# Patient Record
Sex: Female | Born: 1955 | Race: White | Hispanic: No | Smoking: Never smoker
Health system: Southern US, Community
[De-identification: ages and names within clinical notes are randomized; demographics above are authoritative.]

## PROBLEM LIST (undated history)

## (undated) DIAGNOSIS — F419 Anxiety disorder, unspecified: Secondary | ICD-10-CM

## (undated) DIAGNOSIS — Z780 Asymptomatic menopausal state: Secondary | ICD-10-CM

## (undated) DIAGNOSIS — G43909 Migraine, unspecified, not intractable, without status migrainosus: Secondary | ICD-10-CM

## (undated) DIAGNOSIS — IMO0002 Reserved for concepts with insufficient information to code with codable children: Secondary | ICD-10-CM

## (undated) DIAGNOSIS — E785 Hyperlipidemia, unspecified: Secondary | ICD-10-CM

## (undated) HISTORY — DX: Anxiety disorder, unspecified: F41.9

## (undated) HISTORY — DX: Migraine, unspecified, not intractable, without status migrainosus: G43.909

## (undated) HISTORY — DX: Reserved for concepts with insufficient information to code with codable children: IMO0002

## (undated) HISTORY — DX: Hyperlipidemia, unspecified: E78.5

## (undated) HISTORY — DX: Asymptomatic menopausal state: Z78.0

---

## 1977-01-14 HISTORY — PX: KNEE SURGERY: SHX244

## 1986-01-14 HISTORY — PX: OTHER SURGICAL HISTORY: SHX169

## 1988-01-15 HISTORY — PX: TEMPOROMANDIBULAR JOINT SURGERY: SHX35

## 1998-08-22 ENCOUNTER — Other Ambulatory Visit: Admission: RE | Admit: 1998-08-22 | Discharge: 1998-08-22 | Payer: Self-pay | Admitting: Plastic Surgery

## 2000-05-26 ENCOUNTER — Ambulatory Visit (HOSPITAL_BASED_OUTPATIENT_CLINIC_OR_DEPARTMENT_OTHER): Admission: RE | Admit: 2000-05-26 | Discharge: 2000-05-26 | Payer: Self-pay | Admitting: Plastic Surgery

## 2009-01-14 DIAGNOSIS — Z780 Asymptomatic menopausal state: Secondary | ICD-10-CM

## 2009-01-14 HISTORY — DX: Asymptomatic menopausal state: Z78.0

## 2010-01-14 HISTORY — PX: MOHS SURGERY: SUR867

## 2011-06-17 ENCOUNTER — Ambulatory Visit (INDEPENDENT_AMBULATORY_CARE_PROVIDER_SITE_OTHER): Payer: Self-pay | Admitting: Internal Medicine

## 2011-06-17 ENCOUNTER — Encounter: Payer: Self-pay | Admitting: Internal Medicine

## 2011-06-17 VITALS — BP 136/84 | HR 66 | Temp 97.9°F | Ht 68.75 in | Wt 137.5 lb

## 2011-06-17 DIAGNOSIS — N951 Menopausal and female climacteric states: Secondary | ICD-10-CM

## 2011-06-17 DIAGNOSIS — E785 Hyperlipidemia, unspecified: Secondary | ICD-10-CM | POA: Insufficient documentation

## 2011-06-17 DIAGNOSIS — Z780 Asymptomatic menopausal state: Secondary | ICD-10-CM

## 2011-06-17 DIAGNOSIS — Z8669 Personal history of other diseases of the nervous system and sense organs: Secondary | ICD-10-CM

## 2011-06-17 DIAGNOSIS — Z139 Encounter for screening, unspecified: Secondary | ICD-10-CM

## 2011-06-17 DIAGNOSIS — B977 Papillomavirus as the cause of diseases classified elsewhere: Secondary | ICD-10-CM

## 2011-06-17 LAB — CBC WITH DIFFERENTIAL/PLATELET
Eosinophils Relative: 8 % — ABNORMAL HIGH (ref 0–5)
Hemoglobin: 13.1 g/dL (ref 12.0–15.0)
Lymphocytes Relative: 38 % (ref 12–46)
Lymphs Abs: 1.9 10*3/uL (ref 0.7–4.0)
MCV: 90.4 fL (ref 78.0–100.0)
Monocytes Relative: 4 % (ref 3–12)
Neutrophils Relative %: 49 % (ref 43–77)
Platelets: 271 10*3/uL (ref 150–400)
RBC: 4.36 MIL/uL (ref 3.87–5.11)
WBC: 4.9 10*3/uL (ref 4.0–10.5)

## 2011-06-17 LAB — COMPREHENSIVE METABOLIC PANEL
ALT: 16 U/L (ref 0–35)
CO2: 27 mEq/L (ref 19–32)
Calcium: 9.4 mg/dL (ref 8.4–10.5)
Chloride: 105 mEq/L (ref 96–112)
Glucose, Bld: 87 mg/dL (ref 70–99)
Sodium: 140 mEq/L (ref 135–145)
Total Protein: 7.3 g/dL (ref 6.0–8.3)

## 2011-06-17 LAB — LIPID PANEL: Cholesterol: 235 mg/dL — ABNORMAL HIGH (ref 0–200)

## 2011-06-17 MED ORDER — PROGESTERONE MICRONIZED 100 MG PO CAPS: 100.0000 mg | ORAL_CAPSULE | Freq: Every day | ORAL | Status: DC

## 2011-06-17 MED ORDER — DOXYCYCLINE HYCLATE 100 MG PO TABS: 100.0000 mg | ORAL_TABLET | Freq: Two times a day (BID) | ORAL | Status: AC

## 2011-06-17 NOTE — Patient Instructions (Signed)
Labs will be mailed to you  Stop by Xray and schedule mammogram  See me for compelte physcal

## 2011-06-17 NOTE — Progress Notes (Signed)
Subjective:    Patient ID: Carolyn Snyder, female    DOB: 06/20/1955, 56 y.o.   MRN: [id number]  HPI New pt here to establish primary care.  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  And headaches which pt reports are migraine   She has not had a migraine headache in years  Unclear why she is not on a progesterone product as she still has a uterus.  LMP reported in 2011 .  She is on low does Vivelle dot .0375 changed once a week.  Inititally had bothersome hot flushes not now.  No personal or FH of DVT or PE,  Father MI age 44, mother MI age 10  MGM CVA.  No personal of FH of GYN cancers,  First cousin had breast cancer  She brings copy of lab results from Dr. Antonietta Barcelona,  Total chol 282, hdl 77, ldl 182,  TG 117  She reports most recent pap was fine.  02/2010 done by Dr. Ether Griffins.    She has not had a colonoscopy  Allergies  Allergen Reactions  . Penicillins Rash   Past Medical History  Diagnosis Date  . Abnormal Pap smear   . Anxiety   . Hyperlipidemia   . Menopause 2011  . Migraine    Past Surgical History  Procedure Date  . Cryotheraphy 1988  . Temporomandibular joint surgery 1990  . Knee surgery 1979    removed hemangioma with calcium deposit  . Mohs surgery 2012    skin cancer removed nose   History   Social History  . Marital Status: Divorced    Spouse Name: N/A    Number of Children: N/A  . Years of Education: N/A   Occupational History  . Not on file.   Social History Main Topics  . Smoking status: Never Smoker   . Smokeless tobacco: Never Used  . Alcohol Use: Yes     socially  . Drug Use: No  . Sexually Active: Not Currently    Birth Control/ Protection: Post-menopausal   Other Topics Concern  . Not on file   Social History Narrative  . No narrative on file   Family History  Problem Relation Age of Onset  .  Stroke Mother   . Heart disease Mother   . Heart disease Father   . Stroke Maternal Grandmother    Patient Active Problem List  Diagnoses  . HPV in female  . Hyperlipidemia  . Menopause syndrome  . History of migraine headaches   Current Outpatient Prescriptions on File Prior to Visit  Medication Sig Dispense Refill  . estradiol (VIVELLE-DOT) 0.0375 MG/24HR Place 1 patch onto the skin once a week.      . progesterone (PROMETRIUM) 100 MG capsule Take 1 capsule (100 mg total) by mouth daily.  30 capsule  3       Review of Systems See HPI    Objective:   Physical Exam Physical Exam  Nursing note and vitals reviewed.   Repeat Bp in normal range Constitutional: She is oriented to person, place, and time. She appears well-developed and well-nourished.  HENT:  Head: Normocephalic and atraumatic.  Cardiovascular: Normal rate and regular rhythm. Exam reveals no gallop and no friction rub.  No murmur heard.  Pulmonary/Chest: Breath sounds normal. She has no wheezes. She has no rales.  Neurological: She is alert and oriented to person, place, and time.  Skin: Skin is warm and dry.  Psychiatric: She has a normal mood and affect. Her behavior is normal.        Assessment & Plan:  1)  Menopause  Will add prometrium 100 mg daily.  She was counseled regarding risk of endometrial cancer from unopposed estrogen.  She voices understanding.  Given risk/benefit/SE sheet of HT.  Hyperlipidemia   Check today  History of migraine    HIstory of abnormal pap  Will schedule CPE

## 2011-06-18 ENCOUNTER — Other Ambulatory Visit: Payer: Self-pay

## 2011-06-18 LAB — VITAMIN D 25 HYDROXY (VIT D DEFICIENCY, FRACTURES): Vit D, 25-Hydroxy: 39 ng/mL (ref 30–89)

## 2011-06-19 ENCOUNTER — Telehealth: Payer: Self-pay | Admitting: *Deleted

## 2011-06-19 DIAGNOSIS — N950 Postmenopausal bleeding: Secondary | ICD-10-CM

## 2011-06-19 MED ORDER — ESTRADIOL 0.0375 MG/24HR TD PTTW: 1.0000 | MEDICATED_PATCH | TRANSDERMAL | Status: DC

## 2011-06-19 NOTE — Telephone Encounter (Signed)
Pt called stating that she is on Vivelle Dot and has not started her Prometrium but has starting bleeding today.  Spoke with Dr Constance Goltz who ordered TVU and Pelvic  U/S

## 2011-06-21 ENCOUNTER — Ambulatory Visit (HOSPITAL_BASED_OUTPATIENT_CLINIC_OR_DEPARTMENT_OTHER)
Admission: RE | Admit: 2011-06-21 | Discharge: 2011-06-21 | Disposition: A | Discharge status: Home / Self Care | Payer: BC Managed Care – PPO | Source: Ambulatory Visit | Attending: Internal Medicine | Admitting: Internal Medicine

## 2011-06-21 DIAGNOSIS — R9389 Abnormal findings on diagnostic imaging of other specified body structures: Secondary | ICD-10-CM | POA: Insufficient documentation

## 2011-06-21 DIAGNOSIS — N950 Postmenopausal bleeding: Secondary | ICD-10-CM | POA: Insufficient documentation

## 2011-06-24 ENCOUNTER — Telehealth: Payer: Self-pay | Admitting: Internal Medicine

## 2011-06-24 DIAGNOSIS — N8500 Endometrial hyperplasia, unspecified: Secondary | ICD-10-CM | POA: Insufficient documentation

## 2011-06-24 NOTE — Telephone Encounter (Signed)
Spoke with pt. And informed of u/S results and need for endometrial biopsy.  Will refer to Dr. Eustaquio Boyden.  Pt voices understanding

## 2011-06-25 ENCOUNTER — Telehealth: Payer: Self-pay | Admitting: *Deleted

## 2011-06-25 NOTE — Telephone Encounter (Signed)
Copy of labs mailed to pt's home address. 

## 2011-07-01 ENCOUNTER — Encounter: Payer: Self-pay | Admitting: Internal Medicine

## 2011-09-17 ENCOUNTER — Other Ambulatory Visit: Payer: Self-pay | Admitting: Internal Medicine

## 2011-09-17 ENCOUNTER — Encounter: Payer: Self-pay | Admitting: Internal Medicine

## 2011-09-17 ENCOUNTER — Ambulatory Visit (HOSPITAL_BASED_OUTPATIENT_CLINIC_OR_DEPARTMENT_OTHER)
Admission: RE | Admit: 2011-09-17 | Discharge: 2011-09-17 | Disposition: A | Discharge status: Home / Self Care | Payer: BC Managed Care – PPO | Source: Ambulatory Visit | Attending: Internal Medicine | Admitting: Internal Medicine

## 2011-09-17 ENCOUNTER — Telehealth: Payer: Self-pay | Admitting: *Deleted

## 2011-09-17 ENCOUNTER — Ambulatory Visit (INDEPENDENT_AMBULATORY_CARE_PROVIDER_SITE_OTHER): Payer: BC Managed Care – PPO | Admitting: Internal Medicine

## 2011-09-17 VITALS — BP 110/78 | HR 60 | Temp 97.1°F | Resp 16 | Wt 128.0 lb

## 2011-09-17 DIAGNOSIS — N951 Menopausal and female climacteric states: Secondary | ICD-10-CM

## 2011-09-17 DIAGNOSIS — Z1231 Encounter for screening mammogram for malignant neoplasm of breast: Secondary | ICD-10-CM | POA: Insufficient documentation

## 2011-09-17 DIAGNOSIS — Z23 Encounter for immunization: Secondary | ICD-10-CM

## 2011-09-17 DIAGNOSIS — E785 Hyperlipidemia, unspecified: Secondary | ICD-10-CM

## 2011-09-17 DIAGNOSIS — Z0000 Encounter for general adult medical examination without abnormal findings: Secondary | ICD-10-CM

## 2011-09-17 DIAGNOSIS — N8500 Endometrial hyperplasia, unspecified: Secondary | ICD-10-CM

## 2011-09-17 DIAGNOSIS — Z8669 Personal history of other diseases of the nervous system and sense organs: Secondary | ICD-10-CM

## 2011-09-17 LAB — POCT URINALYSIS DIPSTICK
Glucose, UA: NEGATIVE
Nitrite, UA: NEGATIVE
Urobilinogen, UA: NEGATIVE
pH, UA: 5

## 2011-09-17 NOTE — Patient Instructions (Addendum)
Take antibiotic as prescribed  Will refer to Dr. Loreta Ave for colonoscopy

## 2011-09-17 NOTE — Progress Notes (Signed)
Subjective:    Patient ID: Carolyn Snyder, female    DOB: January 24, 1955, 56 y.o.   MRN: [id number]  HPI Carolyn Snyder is here for comprehensive eval.   She did see Dr. Eustaquio Boyden  And pt.  reports her biopsy was fine.  I do not have her office note as yet.   She is taking both Vivelle dot and Prometrium.  She reports she is in a new relationship.  She is active in walking her dog and has been following Carolyn Snyder DIet.  See lipids.  She had muslce aches with Lipitor and stopped two years ago.  She does not wish to take a medication now and would like to continue Carolyn Snyder Diet .  Allergies  Allergen Reactions  . Contrast Media (Iodinated Diagnostic Agents) Hives and Itching  . Keflex (Cephalexin) Hives  . Penicillins Rash   Past Medical History  Diagnosis Date  . Abnormal Pap smear   . Anxiety   . Hyperlipidemia   . Menopause 2011  . Migraine    Past Surgical History  Procedure Date  . Cryotheraphy 1988  . Temporomandibular joint surgery 1990  . Knee surgery 1979    removed hemangioma with calcium deposit  . Mohs surgery 2012    skin cancer removed nose   History   Social History  . Marital Status: Divorced    Spouse Name: N/A    Number of Children: N/A  . Years of Education: N/A   Occupational History  . Not on file.   Social History Main Topics  . Smoking status: Never Smoker   . Smokeless tobacco: Never Used  . Alcohol Use: Yes     socially  . Drug Use: No  . Sexually Active: Not Currently    Birth Control/ Protection: Post-menopausal   Other Topics Concern  . Not on file   Social History Narrative  . No narrative on file   Family History  Problem Relation Age of Onset  . Stroke Mother   . Heart disease Mother   . Heart disease Father   . Stroke Maternal Grandmother    Patient Active Problem List  Diagnosis  . HPV in female  . Hyperlipidemia  . Menopause syndrome  . History of migraine headaches  . Endometrial hyperplasia   Current Outpatient Prescriptions on File Prior  to Visit  Medication Sig Dispense Refill  . estradiol (VIVELLE-DOT) 0.0375 MG/24HR Place 1 patch onto the skin once a week.  8 patch  1  . ibuprofen (ADVIL,MOTRIN) 200 MG tablet Take 400 mg by mouth every 6 (six) hours as needed.      . progesterone (PROMETRIUM) 100 MG capsule Take 1 capsule (100 mg total) by mouth daily.  30 capsule  3       Review of Systems  All other systems reviewed and are negative.      Objective:   Physical Exam Physical Exam  Nursing note and vitals reviewed.  Constitutional: She is oriented to person, place, and time. She appears well-developed and well-nourished.  HENT:  Head: Normocephalic and atraumatic.  Right Ear: Tympanic membrane and ear canal normal. No drainage. Tympanic membrane is not injected and not erythematous.  Left Ear: Tympanic membrane and ear canal normal. No drainage. Tympanic membrane is not injected and not erythematous.  Nose: Nose normal. Right sinus exhibits no maxillary sinus tenderness and no frontal sinus tenderness. Left sinus exhibits no maxillary sinus tenderness and no frontal sinus tenderness.  Mouth/Throat: Oropharynx is clear and moist. No oral  lesions. No oropharyngeal exudate.  Eyes: Conjunctivae and EOM are normal. Pupils are equal, round, and reactive to light.  Neck: Normal range of motion. Neck supple. No JVD present. Carotid bruit is not present. No mass and no thyromegaly present.  Cardiovascular: Normal rate, regular rhythm, S1 normal, S2 normal and intact distal pulses. Exam reveals no gallop and no friction rub.  No murmur heard.  Pulses:  Carotid pulses are 2+ on the right side, and 2+ on the left side.  Dorsalis pedis pulses are 2+ on the right side, and 2+ on the left side.  No carotid bruit. No LE edema  Pulmonary/Chest: Breath sounds normal. She has no wheezes. She has no rales. She exhibits no tenderness.  Breasts No discrete masses no nipple discharge no axillary adenopathy bilaterally.   Abdominal:  Soft. Bowel sounds are normal. She exhibits no distension and no mass. There is no hepatosplenomegaly. There is no tenderness. There is no CVA tenderness.  Rectal no mass guaiac neg. Musculoskeletal: Normal range of motion.  No active synovitis to joints.  Lymphadenopathy:  She has no cervical adenopathy.  She has no axillary adenopathy.  Right: No inguinal and no supraclavicular adenopathy present.  Left: No inguinal and no supraclavicular adenopathy present.  Neurological: She is alert and oriented to person, place, and time. She has normal strength and normal reflexes. She displays no tremor. No cranial nerve deficit or sensory deficit. Coordination and gait normal.  Skin: Skin is warm and dry. No rash noted. No cyanosis. Nails show no clubbing.  Psychiatric: She has a normal mood and affect. Her speech is normal and behavior is normal. Cognition and memory are normal.      Assessment & Plan:  Health Maintenance:  Will give Tdap today.  Will refer to Gi for colonoscopy.  See scanned sheet MM done today.   Advised safe sex practices.    Hematuria: probablae UTI  Will give cipro 500 mg po bid for 5 days.  Will recheck U/A in 3 months  Endometrial Hyperplasia   Will get office note from Dr Eustaquio Boyden  Hyperlipidemia  OK to follow Carolyn Snyder diet for now.  Pt counseled to see me in 3 months for repeat fasting lipids  Menopause  Well conrolled on Vivelle and prometrium

## 2011-09-19 LAB — CULTURE, URINE COMPREHENSIVE
Colony Count: NO GROWTH
Organism ID, Bacteria: NO GROWTH

## 2011-09-24 ENCOUNTER — Telehealth: Payer: Self-pay | Admitting: *Deleted

## 2011-09-24 NOTE — Telephone Encounter (Signed)
Called to notify pt of - urine cx rescheduled a follow up for repeat urinalysis

## 2011-10-01 ENCOUNTER — Telehealth: Payer: Self-pay | Admitting: *Deleted

## 2011-10-01 NOTE — Telephone Encounter (Signed)
Called pt with urine cx results and appt made for 09/26 @11 

## 2011-10-01 NOTE — Telephone Encounter (Signed)
Message copied by Mathews Robinsons on Tue Oct 01, 2011 11:39 AM ------      Message from: Raechel Chute D      Created: Sun Sep 22, 2011  4:55 PM       Karen Kitchens            Call pt and let her know that her urine culture did not show an infection.  Culture had no growth.   Give her an OV in 1-2 weeks and tell her I want to repeat the urine specimen to see if the blood has gone.  Message back with appt date

## 2011-10-02 NOTE — Telephone Encounter (Signed)
Pt called with results

## 2011-10-10 ENCOUNTER — Encounter: Payer: Self-pay | Admitting: Internal Medicine

## 2011-10-10 ENCOUNTER — Ambulatory Visit (INDEPENDENT_AMBULATORY_CARE_PROVIDER_SITE_OTHER): Payer: BC Managed Care – PPO | Admitting: Internal Medicine

## 2011-10-10 VITALS — BP 123/74 | HR 76 | Temp 97.5°F | Resp 18 | Wt 131.0 lb

## 2011-10-10 DIAGNOSIS — N8500 Endometrial hyperplasia, unspecified: Secondary | ICD-10-CM

## 2011-10-10 DIAGNOSIS — R319 Hematuria, unspecified: Secondary | ICD-10-CM

## 2011-10-10 DIAGNOSIS — N951 Menopausal and female climacteric states: Secondary | ICD-10-CM

## 2011-10-10 LAB — POCT URINALYSIS DIPSTICK
Bilirubin, UA: NEGATIVE
Glucose, UA: NEGATIVE
Spec Grav, UA: 1.015
pH, UA: 6.5

## 2011-10-10 MED ORDER — PROGESTERONE MICRONIZED 100 MG PO CAPS: 100.0000 mg | ORAL_CAPSULE | Freq: Every day | ORAL | Status: DC

## 2011-10-10 MED ORDER — ESTRADIOL 0.0375 MG/24HR TD PTTW: 1.0000 | MEDICATED_PATCH | TRANSDERMAL | Status: DC

## 2011-10-10 NOTE — Patient Instructions (Addendum)
See me as needed 

## 2011-10-10 NOTE — Progress Notes (Signed)
Subjective:    Patient ID: Carolyn Snyder, female    DOB: [birth year], 56 y.o.   MRN: [id number]  HPI Jaleigha is here to follow up on hematuria. She is asymptomatic.  FH pos for renal calculi in father  She has history of frequent Utis's i past.  She is in a new relationship with boyfriend and having frequent sexual activity.  No flank pain  She is using Vivelle dot and prometrium for hot flushes. She reports endometrial bx "OK" per dr. Eustaquio Boyden and she will see her again in 6 months to have repeat U/S.  She is using prometriu daily and taking patch only once a week  Allergies  Allergen Reactions  . Penicillins Rash   Past Medical History  Diagnosis Date  . Abnormal Pap smear   . Anxiety   . Hyperlipidemia   . Menopause 2011  . Migraine    Past Surgical History  Procedure Date  . Cryotheraphy 1988  . Temporomandibular joint surgery 1990  . Knee surgery 1979    removed hemangioma with calcium deposit  . Mohs surgery 2012    skin cancer removed nose   History   Social History  . Marital Status: Divorced    Spouse Name: N/A    Number of Children: N/A  . Years of Education: N/A   Occupational History  . Not on file.   Social History Main Topics  . Smoking status: Never Smoker   . Smokeless tobacco: Never Used  . Alcohol Use: Yes     socially  . Drug Use: No  . Sexually Active: Not Currently    Birth Control/ Protection: Post-menopausal   Other Topics Concern  . Not on file   Social History Narrative  . No narrative on file   Family History  Problem Relation Age of Onset  . Stroke Mother   . Heart disease Mother   . Heart disease Father   . Stroke Maternal Grandmother    Patient Active Problem List  Diagnosis  . HPV in female  . Hyperlipidemia  . Menopause syndrome  . History of migraine headaches  . Endometrial hyperplasia  . Hematuria   Current Outpatient Prescriptions on File Prior to Visit  Medication Sig Dispense Refill  . estradiol (VIVELLE-DOT)  0.0375 MG/24HR Place 1 patch onto the skin once a week.  8 patch  1  . ibuprofen (ADVIL,MOTRIN) 200 MG tablet Take 400 mg by mouth every 6 (six) hours as needed.      . progesterone (PROMETRIUM) 100 MG capsule Take 1 capsule (100 mg total) by mouth daily.  30 capsule  3       Review of Systems See HPI    Objective:   Physical Exam Allergies  Allergen Reactions  . Penicillins Rash   Past Medical History  Diagnosis Date  . Abnormal Pap smear   . Anxiety   . Hyperlipidemia   . Menopause 2011  . Migraine    Past Surgical History  Procedure Date  . Cryotheraphy 1988  . Temporomandibular joint surgery 1990  . Knee surgery 1979    removed hemangioma with calcium deposit  . Mohs surgery 2012    skin cancer removed nose   History   Social History  . Marital Status: Divorced    Spouse Name: N/A    Number of Children: N/A  . Years of Education: N/A   Occupational History  . Not on file.   Social History Main Topics  . Smoking status: Never  Smoker   . Smokeless tobacco: Never Used  . Alcohol Use: Yes     socially  . Drug Use: No  . Sexually Active: Not Currently    Birth Control/ Protection: Post-menopausal   Other Topics Concern  . Not on file   Social History Narrative  . No narrative on file   Family History  Problem Relation Age of Onset  . Stroke Mother   . Heart disease Mother   . Heart disease Father   . Stroke Maternal Grandmother    Patient Active Problem List  Diagnosis  . HPV in female  . Hyperlipidemia  . Menopause syndrome  . History of migraine headaches  . Endometrial hyperplasia  . Hematuria   Current Outpatient Prescriptions on File Prior to Visit  Medication Sig Dispense Refill  . estradiol (VIVELLE-DOT) 0.0375 MG/24HR Place 1 patch onto the skin once a week.  8 patch  1  . ibuprofen (ADVIL,MOTRIN) 200 MG tablet Take 400 mg by mouth every 6 (six) hours as needed.      . progesterone (PROMETRIUM) 100 MG capsule Take 1 capsule (100  mg total) by mouth daily.  30 capsule  3    Physical Exam  Nursing note and vitals reviewed.  Constitutional: She is oriented to person, place, and time. She appears well-developed and well-nourished.  HENT:  Head: Normocephalic and atraumatic.  Cardiovascular: Normal rate and regular rhythm. Exam reveals no gallop and no friction rub.  No murmur heard.  Pulmonary/Chest: Breath sounds normal. She has no wheezes. She has no rales.  ABD no cva tenderness  No abd or suprapubic tenderness Neurological: She is alert and oriented to person, place, and time.  Skin: Skin is warm and dry.  Psychiatric: She has a normal mood and affect. Her behavior is normal.          Assessment & Plan:  Hematuria  U/A today trace. Culture neg  Will repeat at yearly exam  Menopausal hot flushes  OK to refill Vivelle dot and prometrium  See me as needed declines flu today

## 2012-03-18 ENCOUNTER — Encounter: Payer: Self-pay | Admitting: *Deleted

## 2012-08-21 ENCOUNTER — Ambulatory Visit (INDEPENDENT_AMBULATORY_CARE_PROVIDER_SITE_OTHER): Payer: BC Managed Care – PPO | Admitting: Family Medicine

## 2012-08-21 ENCOUNTER — Encounter: Payer: Self-pay | Admitting: Family Medicine

## 2012-08-21 VITALS — BP 110/62 | HR 76 | Temp 98.0°F | Resp 16 | Ht 69.0 in | Wt 137.8 lb

## 2012-08-21 DIAGNOSIS — J029 Acute pharyngitis, unspecified: Secondary | ICD-10-CM

## 2012-08-21 MED ORDER — AZITHROMYCIN 250 MG PO TABS: ORAL_TABLET | ORAL | Status: DC

## 2012-08-21 NOTE — Progress Notes (Signed)
[address]   [place], [place]  [address]   [phone]  Subjective:    Patient ID: Carolyn Snyder, female    DOB: 09-06-1955, 57 y.o.   MRN: [id number]  HPI This 57 y.o. female presents for evaluation of sore throat, cough.  Onset four days ago; pain behind eyes; eyes watering.  Sharp pain in throat.  No fever/chills/sweats.  Neighbor with strep.   No ear pain.ST diffuse.  Pain with swallowing.  No rhinorrhea; no nasal congestion; scant cough.  Scant sputum production.  No n/v/d.  Ibuprofen, cough drops.  No tobacco.  Picture framing, waiting tables, babysitting.  +malaise.  No tick bites.  PCP:  Selena Lesser   Review of Systems  Constitutional: Positive for fatigue. Negative for fever, chills and diaphoresis.  HENT: Positive for sore throat, trouble swallowing and voice change. Negative for ear pain, congestion, rhinorrhea, sneezing, postnasal drip and sinus pressure.   Respiratory: Positive for cough. Negative for shortness of breath, wheezing and stridor.   Gastrointestinal: Negative for nausea, vomiting, abdominal pain and diarrhea.  Skin: Negative for rash.  Neurological: Positive for headaches.    Past Medical History  Diagnosis Date  . Abnormal Pap smear   . Anxiety   . Hyperlipidemia   . Menopause 2011  . Migraine     Past Surgical History  Procedure Laterality Date  . Cryotheraphy  1988  . Temporomandibular joint surgery  1990  . Knee surgery  1979    removed hemangioma with calcium deposit  . Mohs surgery  2012    skin cancer removed nose    Prior to Admission medications   Medication Sig Start Date End Date Taking? Authorizing Provider  Multiple Vitamins-Minerals (MULTIVITAMIN WITH MINERALS) tablet Take 1 tablet by mouth daily.   Yes Historical Provider, MD  estradiol (VIVELLE-DOT) 0.0375 MG/24HR Place 1 patch onto the skin once a week. 10/10/11   Kendrick Ranch, MD  ibuprofen (ADVIL,MOTRIN) 200 MG tablet Take 400 mg by mouth every 6 (six) hours as  needed.    Historical Provider, MD  progesterone (PROMETRIUM) 100 MG capsule Take 1 capsule (100 mg total) by mouth daily. 10/10/11 10/09/12  Kendrick Ranch, MD    Allergies  Allergen Reactions  . Penicillins Rash    History   Social History  . Marital Status: Divorced    Spouse Name: N/A    Number of Children: N/A  . Years of Education: N/A   Occupational History  . Not on file.   Social History Main Topics  . Smoking status: Never Smoker   . Smokeless tobacco: Never Used  . Alcohol Use: Yes     Comment: socially  . Drug Use: No  . Sexually Active: Not Currently    Birth Control/ Protection: Post-menopausal   Other Topics Concern  . Not on file   Social History Narrative  . No narrative on file    Family History  Problem Relation Age of Onset  . Stroke Mother   . Heart disease Mother   . Heart disease Father   . Stroke Maternal Grandmother        Objective:   Physical Exam  Nursing note and vitals reviewed. Constitutional: She appears well-developed and well-nourished. No distress.  HENT:  Head: Normocephalic and atraumatic.  Right Ear: External ear normal.  Left Ear: External ear normal.  Nose: Nose normal.  Mouth/Throat: Mucous membranes are normal. Posterior oropharyngeal erythema present. No oropharyngeal exudate, posterior oropharyngeal edema or tonsillar abscesses.  Eyes: Conjunctivae and EOM are normal. Pupils are equal, round, and reactive to light.  Neck: Normal range of motion. Neck supple.  Cardiovascular: Normal rate, regular rhythm and normal heart sounds.   Pulmonary/Chest: Effort normal and breath sounds normal.  Lymphadenopathy:    She has no cervical adenopathy.  Skin: Skin is warm and dry. No rash noted. She is not diaphoretic.  Psychiatric: She has a normal mood and affect. Her behavior is normal.   Results for orders placed in visit on 08/21/12  POCT RAPID STREP A (OFFICE)      Result Value Range   Rapid Strep A Screen  Negative  Negative       Assessment & Plan:  Sore throat - Plan: POCT rapid strep A, Culture, Group A Strep  1. Pharyngitis:  New.  Rapid strep negative; send throat culture.  Treat empirically while awaiting throat culture; Zithromax sent to pharmacy. Continue Ibuprofen. RTC inability to swallow.  Meds ordered this encounter  Medications  . Multiple Vitamins-Minerals (MULTIVITAMIN WITH MINERALS) tablet    Sig: Take 1 tablet by mouth daily.  Marland Kitchen azithromycin (ZITHROMAX) 250 MG tablet    Sig: Two tablets daily x 5 days    Dispense:  10 tablet    Refill:  0

## 2012-08-21 NOTE — Patient Instructions (Addendum)
Sore Throat A sore throat is pain, burning, irritation, or scratchiness of the throat. There is often pain or tenderness when swallowing or talking. A sore throat may be accompanied by other symptoms, such as coughing, sneezing, fever, and swollen neck glands. A sore throat is often the first sign of another sickness, such as a cold, flu, strep throat, or mononucleosis (commonly known as mono). Most sore throats go away without medical treatment. CAUSES  The most common causes of a sore throat include:  A viral infection, such as a cold, flu, or mono.  A bacterial infection, such as strep throat, tonsillitis, or whooping cough.  Seasonal allergies.  Dryness in the air.  Irritants, such as smoke or pollution.  Gastroesophageal reflux disease (GERD). HOME CARE INSTRUCTIONS   Only take over-the-counter medicines as directed by your caregiver.  Drink enough fluids to keep your urine clear or pale yellow.  Rest as needed.  Try using throat sprays, lozenges, or sucking on hard candy to ease any pain (if older than 4 years or as directed).  Sip warm liquids, such as broth, herbal tea, or warm water with honey to relieve pain temporarily. You may also eat or drink cold or frozen liquids such as frozen ice pops.  Gargle with salt water (mix 1 tsp salt with 8 oz of water).  Do not smoke and avoid secondhand smoke.  Put a cool-mist humidifier in your bedroom at night to moisten the air. You can also turn on a hot shower and sit in the bathroom with the door closed for 5 10 minutes. SEEK IMMEDIATE MEDICAL CARE IF:  You have difficulty breathing.  You are unable to swallow fluids, soft foods, or your saliva.  You have increased swelling in the throat.  Your sore throat does not get better in 7 days.  You have nausea and vomiting.  You have a fever or persistent symptoms for more than 2 3 days.  You have a fever and your symptoms suddenly get worse. MAKE SURE YOU:   Understand  these instructions.  Will watch your condition.  Will get help right away if you are not doing well or get worse. Document Released: 02/08/2004 Document Revised: 12/18/2011 Document Reviewed: 09/08/2011 Exit[employer] Patient Information 2014 [employer].  

## 2012-08-23 LAB — CULTURE, GROUP A STREP: Organism ID, Bacteria: NORMAL

## 2013-05-17 ENCOUNTER — Encounter: Payer: Self-pay | Admitting: Internal Medicine

## 2013-05-17 DIAGNOSIS — C4491 Basal cell carcinoma of skin, unspecified: Secondary | ICD-10-CM | POA: Insufficient documentation

## 2013-05-20 ENCOUNTER — Encounter: Payer: Self-pay | Admitting: *Deleted

## 2013-07-10 ENCOUNTER — Ambulatory Visit (INDEPENDENT_AMBULATORY_CARE_PROVIDER_SITE_OTHER): Payer: BC Managed Care – PPO | Admitting: Physician Assistant

## 2013-07-10 VITALS — BP 118/62 | HR 62 | Temp 98.4°F | Resp 16 | Ht 69.0 in | Wt 134.2 lb

## 2013-07-10 DIAGNOSIS — Z87440 Personal history of urinary (tract) infections: Secondary | ICD-10-CM

## 2013-07-10 DIAGNOSIS — R350 Frequency of micturition: Secondary | ICD-10-CM

## 2013-07-10 DIAGNOSIS — R3989 Other symptoms and signs involving the genitourinary system: Secondary | ICD-10-CM

## 2013-07-10 DIAGNOSIS — N3289 Other specified disorders of bladder: Secondary | ICD-10-CM

## 2013-07-10 LAB — POCT UA - MICROSCOPIC ONLY
Casts, Ur, LPF, POC: NEGATIVE
Crystals, Ur, HPF, POC: NEGATIVE
Mucus, UA: NEGATIVE
Yeast, UA: NEGATIVE

## 2013-07-10 LAB — POCT URINALYSIS DIPSTICK
Bilirubin, UA: NEGATIVE
GLUCOSE UA: NEGATIVE
Ketones, UA: NEGATIVE
LEUKOCYTES UA: NEGATIVE
Nitrite, UA: NEGATIVE
Protein, UA: NEGATIVE
UROBILINOGEN UA: 0.2
pH, UA: 5.5

## 2013-07-10 MED ORDER — SULFAMETHOXAZOLE-TRIMETHOPRIM 800-160 MG PO TABS: 1.0000 | ORAL_TABLET | Freq: Two times a day (BID) | ORAL | Status: DC

## 2013-07-10 NOTE — Patient Instructions (Signed)
The antibiotic prescribed today is for your present infection only. It is very important to follow the directions for the medication prescribed. Antibiotics are generally given for a specified period of time (7-10 days, for example) to be taken at specific intervals (every 4, 6, 8 or 12 hours). This is necessary to keep the right amount of the medication in the bloodstream. Too much of the medication may cause an adverse reaction, too little may not be completely effective.  To clear your infection completely, continue taking the antibiotic for the full time of treatment, even if you begin to feel better after a few days.  If you miss a dose of the antibiotic, take it as soon as possible. Then go back to your regular dosing schedule. However, don't double up doses.    Take the trimethoprim-sulfamethoxazole (Septra) as directed.  Finish the full course of medication.  Not finishing the full course puts you at risk for developing a resistant infection  Continue drinking plenty of fluids (water is best!)  I will let you know when your culture results are back and if we need to make any changes to the medication  Please let us know if you are worsening or not improving   Urinary Tract Infection Urinary tract infections (UTIs) can develop anywhere along your urinary tract. Your urinary tract is your body's drainage system for removing wastes and extra water. Your urinary tract includes two kidneys, two ureters, a bladder, and a urethra. Your kidneys are a pair of bean-shaped organs. Each kidney is about the size of your fist. They are located below your ribs, one on each side of your spine. CAUSES Infections are caused by microbes, which are microscopic organisms, including fungi, viruses, and bacteria. These organisms are so small that they can only be seen through a microscope. Bacteria are the microbes that most commonly cause UTIs. SYMPTOMS  Symptoms of UTIs may vary by age and gender of the patient  and by the location of the infection. Symptoms in young women typically include a frequent and intense urge to urinate and a painful, burning feeling in the bladder or urethra during urination. Older women and men are more likely to be tired, shaky, and weak and have muscle aches and abdominal pain. A fever may mean the infection is in your kidneys. Other symptoms of a kidney infection include pain in your back or sides below the ribs, nausea, and vomiting. DIAGNOSIS To diagnose a UTI, your caregiver will ask you about your symptoms. Your caregiver also will ask to provide a urine sample. The urine sample will be tested for bacteria and white blood cells. White blood cells are made by your body to help fight infection. TREATMENT  Typically, UTIs can be treated with medication. Because most UTIs are caused by a bacterial infection, they usually can be treated with the use of antibiotics. The choice of antibiotic and length of treatment depend on your symptoms and the type of bacteria causing your infection. HOME CARE INSTRUCTIONS  If you were prescribed antibiotics, take them exactly as your caregiver instructs you. Finish the medication even if you feel better after you have only taken some of the medication.  Drink enough water and fluids to keep your urine clear or pale yellow.  Avoid caffeine, tea, and carbonated beverages. They tend to irritate your bladder.  Empty your bladder often. Avoid holding urine for long periods of time.  Empty your bladder before and after sexual intercourse.  After a bowel movement,  women should cleanse from front to back. Use each tissue only once. SEEK MEDICAL CARE IF:   You have back pain.  You develop a fever.  Your symptoms do not begin to resolve within 3 days. SEEK IMMEDIATE MEDICAL CARE IF:   You have severe back pain or lower abdominal pain.  You develop chills.  You have nausea or vomiting.  You have continued burning or discomfort with  urination. MAKE SURE YOU:   Understand these instructions.  Will watch your condition.  Will get help right away if you are not doing well or get worse. Document Released: 10/10/2004 Document Revised: 07/02/2011 Document Reviewed: 02/08/2011 [facility] Patient Information 2015 [place], [place]. This information is not intended to replace advice given to you by your health care provider. Make sure you discuss any questions you have with your health care provider.

## 2013-07-10 NOTE — Progress Notes (Signed)
Subjective:    Patient ID: Carolyn Snyder, female    DOB: [birth year], 58 y.o.   MRN: [id number]  HPI   Carolyn Snyder is a very pleasant 59 yr old female here with concern for UTI.  Reports she stayed in the swimming pool too long which has caused UTIs in the past.  Symptoms include bladder pressure and feeling of incomplete bladder emptying.  She had increased urinary frequency but admits she has been drinking increased amounts of water.  Denies dysuria or hematuria.  No abd pain, NV, FC, flank pain.  Many UTIs in the past - last about 2 yrs ago.  She denies vaginal symptoms.  She is not currently sexually active.  She did take 4 doses of left over Septra over the last two days - last dose yesterday.     Review of Systems  Constitutional: Negative for fever and chills.  Respiratory: Negative.   Cardiovascular: Negative.   Gastrointestinal: Negative for nausea, vomiting and abdominal pain.  Genitourinary: Positive for frequency. Negative for dysuria, hematuria and flank pain.  Musculoskeletal: Negative.   Skin: Negative.        Objective:   Physical Exam  Vitals reviewed. Constitutional: She is oriented to person, place, and time. She appears well-developed and well-nourished. No distress.  HENT:  Head: Normocephalic and atraumatic.  Eyes: Conjunctivae are normal. No scleral icterus.  Cardiovascular: Normal rate, regular rhythm and normal heart sounds.   Pulmonary/Chest: Effort normal and breath sounds normal. She has no wheezes. She has no rales.  Abdominal: Soft. Bowel sounds are normal. There is no tenderness. There is no CVA tenderness.  Neurological: She is alert and oriented to person, place, and time.  Skin: Skin is warm and dry.  Psychiatric: She has a normal mood and affect. Her behavior is normal.     Results for orders placed in visit on 07/10/13  POCT URINALYSIS DIPSTICK      Result Value Ref Range   Color, UA yellow     Clarity, UA clear     Glucose, UA neg     Bilirubin, UA neg     Ketones, UA neg     Spec Grav, UA <=1.005     Blood, UA small     pH, UA 5.5     Protein, UA neg     Urobilinogen, UA 0.2     Nitrite, UA neg     Leukocytes, UA Negative    POCT UA - MICROSCOPIC ONLY      Result Value Ref Range   WBC, Ur, HPF, POC 1-2     RBC, urine, microscopic 2-5     Bacteria, U Microscopic trace     Mucus, UA neg     Epithelial cells, urine per micros 0-1     Crystals, Ur, HPF, POC neg     Casts, Ur, LPF, POC neg     Yeast, UA neg          Assessment & Plan:  Urinary frequency - Plan: POCT urinalysis dipstick, POCT UA - Microscopic Only, Urine culture, sulfamethoxazole-trimethoprim (BACTRIM DS) 800-160 MG per tablet  Sensation of pressure in bladder area - Plan: Urine culture, sulfamethoxazole-trimethoprim (BACTRIM DS) 800-160 MG per tablet  History of UTI - Plan: Urine culture, sulfamethoxazole-trimethoprim (BACTRIM DS) 800-160 MG per tablet   Carolyn Snyder is a very pleasant 58 yr old female with concern for UTI.  She has experienced bladder pain/pressure and increased urinary frequency.  UA is not impressive  for infection today, but she has taken four doses of septra already.  Will go ahead and send a culture though this may be of low yield.  At this point, I think it is probably best to just continue Septra - I have sent a 5 day course to her pharmacy.  Encouraged her to finish the full course as directed.  Pt asks for refills on abx, but discussed with her that this is not appropriate - if she feels like she is not improving or is worsening she will need to RTC.  Pt to call or RTC if worsening or not improving  E. Natividad Brood MHS, PA-C [facility] 6/27/201511:41 AM

## 2013-07-11 LAB — URINE CULTURE
COLONY COUNT: NO GROWTH
ORGANISM ID, BACTERIA: NO GROWTH

## 2014-02-16 ENCOUNTER — Telehealth: Payer: Self-pay | Admitting: *Deleted

## 2014-02-16 DIAGNOSIS — Z139 Encounter for screening, unspecified: Secondary | ICD-10-CM

## 2014-02-16 NOTE — Telephone Encounter (Signed)
Pt called in stating she has new insurance and needs a referral from PCP to continue seeing Jarome Matin, MD at [facility]. I have entered the referral and pt already has appointment.

## 2014-04-27 ENCOUNTER — Encounter: Payer: Self-pay | Admitting: *Deleted

## 2019-01-13 ENCOUNTER — Emergency Department (HOSPITAL_COMMUNITY)
Admission: EM | Admit: 2019-01-13 | Discharge: 2019-01-13 | Disposition: A | Discharge status: Home / Self Care | Payer: BLUE CROSS/BLUE SHIELD | Attending: Emergency Medicine | Admitting: Emergency Medicine

## 2019-01-13 ENCOUNTER — Other Ambulatory Visit: Payer: Self-pay

## 2019-01-13 ENCOUNTER — Encounter (HOSPITAL_COMMUNITY): Payer: Self-pay | Admitting: Emergency Medicine

## 2019-01-13 ENCOUNTER — Other Ambulatory Visit: Payer: Self-pay | Admitting: Medical

## 2019-01-13 DIAGNOSIS — I480 Paroxysmal atrial fibrillation: Secondary | ICD-10-CM | POA: Diagnosis not present

## 2019-01-13 DIAGNOSIS — E785 Hyperlipidemia, unspecified: Secondary | ICD-10-CM | POA: Diagnosis not present

## 2019-01-13 DIAGNOSIS — R000 Tachycardia, unspecified: Secondary | ICD-10-CM | POA: Diagnosis present

## 2019-01-13 DIAGNOSIS — R002 Palpitations: Secondary | ICD-10-CM

## 2019-01-13 DIAGNOSIS — E0590 Thyrotoxicosis, unspecified without thyrotoxic crisis or storm: Secondary | ICD-10-CM | POA: Insufficient documentation

## 2019-01-13 LAB — BASIC METABOLIC PANEL
Anion gap: 9 (ref 5–15)
BUN: 17 mg/dL (ref 8–23)
CO2: 25 mmol/L (ref 22–32)
Calcium: 9.9 mg/dL (ref 8.9–10.3)
Chloride: 108 mmol/L (ref 98–111)
Creatinine, Ser: 0.48 mg/dL (ref 0.44–1.00)
GFR calc Af Amer: >60 mL/min (ref >60)
GFR calc non Af Amer: >60 mL/min (ref >60)
Glucose, Bld: 94 mg/dL (ref 70–99)
Potassium: 4.2 mmol/L (ref 3.5–5.1)
Sodium: 142 mmol/L (ref 135–145)

## 2019-01-13 LAB — HEPATIC FUNCTION PANEL
ALT: 38 U/L (ref 0–44)
AST: 34 U/L (ref 15–41)
Albumin: 3.7 g/dL (ref 3.5–5.0)
Alkaline Phosphatase: 69 U/L (ref 38–126)
Bilirubin, Direct: 0.1 mg/dL (ref 0.0–0.2)
Indirect Bilirubin: 0.7 mg/dL (ref 0.3–0.9)
Total Bilirubin: 0.8 mg/dL (ref 0.3–1.2)
Total Protein: 7.3 g/dL (ref 6.5–8.1)

## 2019-01-13 LAB — CBC WITH DIFFERENTIAL/PLATELET
Abs Immature Granulocytes: 0.02 10*3/uL (ref 0.00–0.07)
Basophils Absolute: 0 10*3/uL (ref 0.0–0.1)
Basophils Relative: 1 %
Eosinophils Absolute: 0 10*3/uL (ref 0.0–0.5)
Eosinophils Relative: 0 %
HCT: 37.4 % (ref 36.0–46.0)
Hemoglobin: 12.3 g/dL (ref 12.0–15.0)
Immature Granulocytes: 0 %
Lymphocytes Relative: 24 %
Lymphs Abs: 1.3 10*3/uL (ref 0.7–4.0)
MCH: 30.4 pg (ref 26.0–34.0)
MCHC: 32.9 g/dL (ref 30.0–36.0)
MCV: 92.3 fL (ref 80.0–100.0)
Monocytes Absolute: 0.4 10*3/uL (ref 0.1–1.0)
Monocytes Relative: 6 %
Neutro Abs: 3.9 10*3/uL (ref 1.7–7.7)
Neutrophils Relative %: 69 %
Platelets: 337 10*3/uL (ref 150–400)
RBC: 4.05 MIL/uL (ref 3.87–5.11)
RDW: 11.5 % (ref 11.5–15.5)
WBC: 5.7 10*3/uL (ref 4.0–10.5)
nRBC: 0 % (ref 0.0–0.2)

## 2019-01-13 LAB — T4, FREE: Free T4: 2.96 ng/dL — ABNORMAL HIGH (ref 0.61–1.12)

## 2019-01-13 LAB — TSH: TSH: <0.01 u[IU]/mL — ABNORMAL LOW (ref 0.350–4.500)

## 2019-01-13 LAB — MAGNESIUM: Magnesium: 2.1 mg/dL (ref 1.7–2.4)

## 2019-01-13 MED ORDER — DILTIAZEM HCL ER COATED BEADS 120 MG PO CP24: 120.0000 mg | ORAL_CAPSULE | Freq: Every day | ORAL | 2 refills | Status: DC

## 2019-01-13 NOTE — ED Provider Notes (Signed)
Care assumed at shift change from Law, PA-C, pending free T4 and Cardiology recommendations. See her note for full HPI and workup. Briefly, pt presenting with 3 episodes palpitations and weight loss over 3 last months. Question afib vs sinus tach? EMS gave cardizem with improved rate. Workup reveals Low TSH, pending T3 T4. Cardiology PA evaluated, attending to evaluate and recommend disposition.  Physical Exam  BP 137/61   Pulse 90   Temp 99.4 F (37.4 C)   Resp 15   SpO2 97%   Physical Exam Vitals and nursing note reviewed.  Constitutional:      General: She is not in acute distress.    Appearance: She is well-developed.  HENT:     Head: Normocephalic and atraumatic.  Eyes:     Conjunctiva/sclera: Conjunctivae normal.  Cardiovascular:     Rate and Rhythm: Normal rate.  Pulmonary:     Effort: Pulmonary effort is normal.  Neurological:     Mental Status: She is alert.  Psychiatric:        Mood and Affect: Mood normal.        Behavior: Behavior normal.    Results for orders placed or performed during the hospital encounter of [id number]  Basic metabolic panel  Result Value Ref Range   Sodium 142 135 - 145 mmol/L   Potassium 4.2 3.5 - 5.1 mmol/L   Chloride 108 98 - 111 mmol/L   CO2 25 22 - 32 mmol/L   Glucose, Bld 94 70 - 99 mg/dL   BUN 17 8 - 23 mg/dL   Creatinine, Ser 0.48 0.44 - 1.00 mg/dL   Calcium 9.9 8.9 - 10.3 mg/dL   GFR calc non Af Amer >60 >60 mL/min   GFR calc Af Amer >60 >60 mL/min   Anion gap 9 5 - 15  CBC with Differential  Result Value Ref Range   WBC 5.7 4.0 - 10.5 K/uL   RBC 4.05 3.87 - 5.11 MIL/uL   Hemoglobin 12.3 12.0 - 15.0 g/dL   HCT 37.4 36.0 - 46.0 %   MCV 92.3 80.0 - 100.0 fL   MCH 30.4 26.0 - 34.0 pg   MCHC 32.9 30.0 - 36.0 g/dL   RDW 11.5 11.5 - 15.5 %   Platelets 337 150 - 400 K/uL   nRBC 0.0 0.0 - 0.2 %   Neutrophils Relative % 69 %   Neutro Abs 3.9 1.7 - 7.7 K/uL   Lymphocytes Relative 24 %   Lymphs Abs 1.3 0.7 - 4.0 K/uL   Monocytes Relative 6 %   Monocytes Absolute 0.4 0.1 - 1.0 K/uL   Eosinophils Relative 0 %   Eosinophils Absolute 0.0 0.0 - 0.5 K/uL   Basophils Relative 1 %   Basophils Absolute 0.0 0.0 - 0.1 K/uL   Immature Granulocytes 0 %   Abs Immature Granulocytes 0.02 0.00 - 0.07 K/uL  TSH  Result Value Ref Range   TSH <0.010 (L) 0.350 - 4.500 uIU/mL  Magnesium  Result Value Ref Range   Magnesium 2.1 1.7 - 2.4 mg/dL  T4, free  Result Value Ref Range   Free T4 2.96 (H) 0.61 - 1.12 ng/dL  Hepatic function panel  Result Value Ref Range   Total Protein 7.3 6.5 - 8.1 g/dL   Albumin 3.7 3.5 - 5.0 g/dL   AST 34 15 - 41 U/L   ALT 38 0 - 44 U/L   Alkaline Phosphatase 69 38 - 126 U/L   Total Bilirubin 0.8 0.3 - 1.2  mg/dL   Bilirubin, Direct 0.1 0.0 - 0.2 mg/dL   Indirect Bilirubin 0.7 0.3 - 0.9 mg/dL   No results found.  ED Course/Procedures   Clinical Course as of Jan 13 2007  Wed Jan 13, 2019  1715 Cardiology recommends pt appropriate for outpt management. Cards to prescribe cardizem, and schedule appt as well as monitor and Echo.    [JR]  [id number] Dr. Margaretann Loveless with cardiology   [JR]    Clinical Course User Index [JR] Johnnye Sandford, [place] N, PA-C    Procedures  MDM  TSH is low and free T4 elevated. Discussd with Dr. Roslynn Amble. Pt has close PCP f.u appt. Discussed importance of follow up for further testing and medical management of hyperthyroid. Pt agreeable to plan and safe for d/c.       Tarence Searcy, [place] N, PA-C 01/13/19 2009    Lucrezia Starch, MD 01/14/19 [id number]

## 2019-01-13 NOTE — ED Triage Notes (Addendum)
Pt states this is 3 rd time that she has had palipations , today about 4am  felt the palpitations and felt wobbly on her feet, this am started to feel  palitations went to urgent care and was found to have  afib RVR ,  bp 180/100 heart ratye 180 - 200 they called  Ems they gave her 10 of cardizem now bp better 120/80, HR 120 now 22 left hand, denies cp pressure  States has lost some weight in the past month  Unintentionally

## 2019-01-13 NOTE — Discharge Instructions (Addendum)
Please begin taking the diltiazem/Cardizem as directed by the cardiologist. Your upcoming appointments as scheduled by the cardiologist. It is important you follow-up closely with your primary care provider to discuss your elevated thyroid level.  You will need additional testing and work-up to determine adequate management of this. Return to the emergency department if you develop worsening symptoms.

## 2019-01-13 NOTE — Consult Note (Addendum)
Cardiology Consultation:   Patient ID: Carolyn Snyder MRN: [id number]; DOB: Nov 04, 1955  Admit date: 01/13/2019 Date of Consult: 01/13/2019  Primary Care Provider: Andria Frames, PA-C Primary Cardiologist: Elouise Munroe, MD  Primary Electrophysiologist:  None    Patient Profile:   Carolyn Snyder is a 63 y.o. female with a hx of anxiety, migraine, HLD who is being seen today for the evaluation of palpitations at the request of Dr. Tyrone Nine.  History of Present Illness:   Ms. Heidt has not seen cardiology in the past. Denies history of MI, stent, heart failure, or stroke. Family history positive for heart attack in both her Mother and father (in their 44-60s). She works part-time at a relatively active job. She has unintentionally lost about 12-15 lbs since September. She drinks about 3 drinks weekly. No tobacco/drug use. She drinks one small bottle of coke daily. She does have a history of anxiety/panic attacks that has made her overly cautious sometimes.  The patient presented to the ED 01/13/19 for palpitations. She has had 3 episodes in the last couple months. The first one occurred while at the beach the morning after she had a couple drinks. The second was more recently after just one drink. This morning she felt them waking her up from sleep. She felt her heart beat was abnormal and fast, like a pounding out of her chest. Denied chest pain or shortness of breath. No lower leg edema or orthopnea. No fever, chills, or recent illnesses. She went to an Urgent care and was told she might have afib RVR. EMS was called and they administered 10 mg Cardizem.  Upon arrival to the ED she was noted to be in NSR. B/P 160/68, pulse 104, 99.4 F, RR 16, 98% O2. Labs show potassium 4.2, glucose 94, creatinine 0.48. Magnesium 2.1. WBC 5.7, Hgb 12.3. TSH pending. Cardiology was consulted.  Heart Pathway Score:     Past Medical History:  Diagnosis Date  . Abnormal Pap smear   . Anxiety   .  Hyperlipidemia   . Menopause 2011  . Migraine     Past Surgical History:  Procedure Laterality Date  . cryotheraphy  1988  . KNEE SURGERY  1979   removed hemangioma with calcium deposit  . MOHS SURGERY  2012   skin cancer removed nose  . TEMPOROMANDIBULAR JOINT SURGERY  1990     Home Medications:  Prior to Admission medications   Medication Sig Start Date End Date Taking? Authorizing Provider  sulfamethoxazole-trimethoprim (BACTRIM DS) 800-160 MG per tablet Take 1 tablet by mouth 2 (two) times daily. 07/10/13   Theda Sers, PA-C    Inpatient Medications: Scheduled Meds:  Continuous Infusions:  PRN Meds:   Allergies:    Allergies  Allergen Reactions  . Penicillins Rash    Social History:   Social History   Socioeconomic History  . Marital status: Divorced    Spouse name: Not on file  . Number of children: Not on file  . Years of education: Not on file  . Highest education level: Not on file  Occupational History  . Not on file  Tobacco Use  . Smoking status: Never Smoker  . Smokeless tobacco: Never Used  Substance and Sexual Activity  . Alcohol use: Yes    Comment: socially  . Drug use: No  . Sexual activity: Not Currently    Birth control/protection: Post-menopausal  Other Topics Concern  . Not on file  Social History Narrative  . Not  on file   Social Determinants of Health   Financial Resource Strain:   . Difficulty of Paying Living Expenses: Not on file  Food Insecurity:   . Worried About Charity fundraiser in the Last Year: Not on file  . Ran Out of Food in the Last Year: Not on file  Transportation Needs:   . Lack of Transportation (Medical): Not on file  . Lack of Transportation (Non-Medical): Not on file  Physical Activity:   . Days of Exercise per Week: Not on file  . Minutes of Exercise per Session: Not on file  Stress:   . Feeling of Stress : Not on file  Social Connections:   . Frequency of Communication with Friends and  Family: Not on file  . Frequency of Social Gatherings with Friends and Family: Not on file  . Attends Religious Services: Not on file  . Active Member of Clubs or Organizations: Not on file  . Attends Archivist Meetings: Not on file  . Marital Status: Not on file  Intimate Partner Violence:   . Fear of Current or Ex-Partner: Not on file  . Emotionally Abused: Not on file  . Physically Abused: Not on file  . Sexually Abused: Not on file    Family History:   Family History  Problem Relation Age of Onset  . Stroke Mother   . Heart disease Mother   . Heart disease Father   . Stroke Maternal Grandmother      ROS:  Please see the history of present illness.  All other ROS reviewed and negative.     Physical Exam/Data:   Vitals:   01/13/19 1330 01/13/19 1345 01/13/19 1400 01/13/19 1415  BP: 120/79 126/86 137/62 137/61  Pulse: 94 97 92 90  Resp: 19 (!) 28 20 15   Temp:      SpO2: 97% 98% 97% 97%   No intake or output data in the 24 hours ending 01/13/19 1518 Last 3 Weights 07/10/2013 08/21/2012 10/10/2011  Weight (lbs) 134 lb 3.2 oz 137 lb 12.8 oz 131 lb  Weight (kg) 60.873 kg 62.506 kg 59.421 kg     There is no height or weight on file to calculate BMI.  General:  Well nourished, well developed, in no acute distress HEENT: normal Lymph: no adenopathy Neck: no JVD Endocrine:  No thryomegaly Vascular: No carotid bruits; FA pulses 2+ bilaterally without bruits  Cardiac:  normal S1, S2; RRR; no murmur  Lungs:  clear to auscultation bilaterally, no wheezing, rhonchi or rales  Abd: soft, nontender, no hepatomegaly  Ext: no edema Musculoskeletal:  No deformities, BUE and BLE strength normal and equal Skin: warm and dry  Neuro:  CNs 2-12 intact, no focal abnormalities noted Psych:  Normal affect   EKG:  The EKG was personally reviewed and demonstrates:  Sinus tachycardia, 105 bpm, T wave flattening in aVL Telemetry:  Telemetry was personally reviewed and demonstrates:   NSR, HR 90s, sinus tachycardia to 152  Relevant CV Studies:  None  Laboratory Data:  High Sensitivity Troponin:  No results for input(s): TROPONINIHS in the last 720 hours.   Chemistry Recent Labs  Lab 01/13/19 1300  NA 142  K 4.2  CL 108  CO2 25  GLUCOSE 94  BUN 17  CREATININE 0.48  CALCIUM 9.9  GFRNONAA >60  GFRAA >60  ANIONGAP 9    No results for input(s): PROT, ALBUMIN, AST, ALT, ALKPHOS, BILITOT in the last 168 hours. Hematology Recent Labs  Lab 01/13/19 1300  WBC 5.7  RBC 4.05  HGB 12.3  HCT 37.4  MCV 92.3  MCH 30.4  MCHC 32.9  RDW 11.5  PLT 337   BNPNo results for input(s): BNP, PROBNP in the last 168 hours.  DDimer No results for input(s): DDIMER in the last 168 hours.   Radiology/Studies:  No results found. {  Assessment and Plan:   Palpitations Patient presents with palpitations. Has had 3 episodes in the last couple months. No chest pain, sob, edema, orthopnea. CXR not significant. EKG and strips appear to show sinus tachycardia. Unsure however if previous episodes were afib. - Mag 2.1 - K+ 4.2 - TSH <0.01 - Patient denies heavy caffeine use. She does endorse h/o of anxiety and panic attacks. - Check echo and would place heart monitor. Could possibly be done outpatient.  - Md to see  HLD - 146 in 2013 - Patient was placed on Rosuvastatin 5 mg daily and says lipids have since improved  Low TSH - possible hyperthyroidism - TSH low at <0.01, check T3 and T4 - Would also explain weight  - follow up with PCP  For questions or updates, please contact [place] Please consult [email or web address] for contact info under     Signed, Carolyn Ninfa Meeker, PA-C  01/13/2019 3:18 PM   Patient seen and examined with Carolyn Furth PA-C.  Agree as above, with the following exceptions and changes as noted below.  Patient presents with intermittent palpitations that have been worsening, with several episodes this morning.  She occasionally has a social  alcoholic beverage and notices palpitations afterward.  She presented to the ED and was found to have atrial fibrillation with rapid ventricular response.  I reviewed this electrocardiogram and it does appear it could represent atrial fibrillation.  She has not been known to have A. fib in the past and has no known structural heart disease.  Family history significant for her father who had sudden cardiac death from MI at age 11 and mother who had coronary artery disease with stenting but lived into her 34s.  She denies chest pain and significant shortness of breath.  Has occasional dizziness with her palpitations.  No lifetime syncope.   Gen: NAD, neck: No thyromegaly or nodules appreciated CV: RRR, no murmurs, Lungs: clear, Abd: soft, Extrem: Warm, well perfused, no edema, Neuro/Psych: alert and oriented x 3, normal mood and affect. All available labs, radiology testing, previous records reviewed.   #1 Atrial fibrillation #2 HLD Her ECG represents an episode of atrial fibrillation.  She also had an ECG in the emergency department that looked representative of an SVT versus atrial flutter.  I would like for her to wear a cardiac monitor so we can better quantitate her rates and rhythm. We discussed rate, rhythm, and stroke prevention as the core prongs of treatment of atrial fibrillation.  She responded well to IV diltiazem and I think she would do well on oral diltiazem.  We will start the patient on diltiazem 120 mg daily.  She has no signs of heart failure decompensation.  We will check an echocardiogram as an outpatient to ensure she has no structural heart disease or low ejection fraction while taking diltiazem.  I would like to see the patient back next week in clinic to ensure she is tolerating diltiazem without hypotension and that she has not increasingly symptomatic in A. fib.  She appears to have hyperthyroidism with a low TSH, which may also be contributing to her  weight loss.  I instructed her  to review this with her primary care physician and to review other reasons for unintended weight loss including age-related screening (colonoscopy, mammogram).  We will get a lipid panel at her visit with me given that she appears to have hyperlipidemia and has been on Crestor 5 mg daily.  With her family history of heart disease, we may need to do more aggressive risk factor modification.  I have instructed her that if she would like to take a daily aspirin for the indication of primary prevention of CAD that is appropriate.  However I would not use aspirin for CHA2DS2-VASc score of 1 since we discussed that this is not appropriate anticoagulation for stroke and atrial fibrillation.  We discussed that with that risk level for stroke, we do not need to initiate anticoagulation today but can discuss that as she will be 63 years old in a few years and will score 2 points.  We can participate in shared decision making over time as to if and when to initiate anticoagulation.  Plan: -Initiate diltiazem 120 mg daily -Outpatient echocardiogram -Outpatient cardiac monitor -Follow-up with primary care regarding unintended weight loss and hyperthyroidism -Initiate aspirin regimen for primary prevention of CAD -Continue Crestor 5 mg daily for hyperlipidemia -Follow-up in clinic with me next week to review symptoms and medication therapy.  Elouise Munroe 01/13/19 5:23 PM

## 2019-01-13 NOTE — ED Provider Notes (Signed)
[facility] EMERGENCY DEPARTMENT Provider Note   CSN: [id number] Arrival date & time: 01/13/19  1041     History Chief Complaint  Patient presents with  . Tachycardia    Carolyn Snyder is a 63 y.o. female with history of anxiety, migraine, hyperlipidemia who presents with episode of tachycardia that began around 4 AM this morning.  This is the third time the something like this is happened.  Patient notes that she has been more aware of her heartbeat as it has been irregular intermittently and a little faster than normal.  This morning was much faster than normal.  She was seen in urgent care and found to be in A. fib RVR.  EMS gave 10 of Cardizem and patient is now in NSR.  She still feels a little anxious, but denies the feeling she had earlier.  She reports she has not had any chest pain or shortness of breath, abdominal pain, nausea, vomiting.  She has noted she has lost good amount of weight over the past 2 to 3 months without trying.  She denies any fevers.  HPI     Past Medical History:  Diagnosis Date  . Abnormal Pap smear   . Anxiety   . Hyperlipidemia   . Menopause 2011  . Migraine     Patient Active Problem List   Diagnosis Date Noted  . Basal cell carcinoma of skin  04/2013 05/17/2013  . Hematuria 10/10/2011  . Endometrial hyperplasia 06/24/2011  . 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 Hyperlipidemia 06/17/2011  . Menopause syndrome 06/17/2011  . History of migraine headaches 06/17/2011    Past Surgical History:  Procedure Laterality Date  . cryotheraphy  1988  . KNEE SURGERY  1979   removed hemangioma with calcium deposit  . MOHS SURGERY  2012   skin cancer removed nose  . TEMPOROMANDIBULAR JOINT SURGERY  1990     OB History    Gravida  0   Para  0   Term  0   Preterm  0   AB  0   Living  0     SAB  0   TAB  0   Ectopic  0   Multiple  0   Live Births              Family History  Problem Relation Age of Onset  . Stroke  Mother   . Heart disease Mother   . Heart disease Father   . Stroke Maternal Grandmother     Social History   Tobacco Use  . Smoking status: Never Smoker  . Smokeless tobacco: Never Used  Substance Use Topics  . Alcohol use: Yes    Comment: socially  . Drug use: No    Home Medications Prior to Admission medications   Medication Sig Start Date End Date Taking? Authorizing Provider  diltiazem (CARDIZEM CD) 120 MG 24 hr capsule Take 1 capsule (120 mg total) by mouth daily. 01/13/19 01/13/20  Furth, Cadence H, PA-C  sulfamethoxazole-trimethoprim (BACTRIM DS) 800-160 MG per tablet Take 1 tablet by mouth 2 (two) times daily. 07/10/13   Theda Sers, PA-C    Allergies    Penicillins  Review of Systems   Review of Systems  Constitutional: Negative for chills and fever.  HENT: Negative for facial swelling and sore throat.   Respiratory: Negative for shortness of breath.   Cardiovascular: Positive for palpitations. Negative for chest pain.  Gastrointestinal: Negative for abdominal pain, nausea  and vomiting.  Genitourinary: Negative for dysuria.  Musculoskeletal: Negative for back pain.  Skin: Negative for rash and wound.  Neurological: Negative for headaches.  Psychiatric/Behavioral: The patient is nervous/anxious.     Physical Exam Updated Vital Signs BP 137/61   Pulse 90   Temp 99.4 F (37.4 C)   Resp 15   SpO2 97%   Physical Exam Vitals and nursing note reviewed.  Constitutional:      General: She is not in acute distress.    Appearance: She is well-developed. She is not diaphoretic.  HENT:     Head: Normocephalic and atraumatic.     Mouth/Throat:     Pharynx: No oropharyngeal exudate.  Eyes:     General: No scleral icterus.       Right eye: No discharge.        Left eye: No discharge.     Conjunctiva/sclera: Conjunctivae normal.     Pupils: Pupils are equal, round, and reactive to light.  Neck:     Thyroid: No thyromegaly.  Cardiovascular:     Rate and  Rhythm: Regular rhythm. Tachycardia present.     Heart sounds: Normal heart sounds. No murmur. No friction rub. No gallop.      Comments: NSR around 105 now Pulmonary:     Effort: Pulmonary effort is normal. No respiratory distress.     Breath sounds: Normal breath sounds. No stridor. No wheezing or rales.  Abdominal:     General: Bowel sounds are normal. There is no distension.     Palpations: Abdomen is soft.     Tenderness: There is no abdominal tenderness. There is no guarding or rebound.  Musculoskeletal:     Cervical back: Normal range of motion and neck supple.  Lymphadenopathy:     Cervical: No cervical adenopathy.  Skin:    General: Skin is warm and dry.     Coloration: Skin is not pale.     Findings: No rash.  Neurological:     Mental Status: She is alert.     Coordination: Coordination normal.     ED Results / Procedures / Treatments   Labs (all labs ordered are listed, but only abnormal results are displayed) Labs Reviewed  TSH - Abnormal; Notable for the following components:      Result Value   TSH <0.010 (*)    All other components within normal limits  BASIC METABOLIC PANEL  CBC WITH DIFFERENTIAL/PLATELET  MAGNESIUM  T4, FREE  T3  HEPATIC FUNCTION PANEL    EKG EKG Interpretation  Date/Time:  Wednesday January 13 2019 10:45:28 EST Ventricular Rate:  105 PR Interval:    QRS Duration: 84 QT Interval:  318 QTC Calculation: 421 R Axis:   16 Text Interpretation: Sinus tachycardia No old tracing to compare Confirmed by Deno Etienne [phone]) on 01/13/2019 11:40:47 AM   Radiology No results found.  Procedures Procedures (including critical care time)  Medications Ordered in ED Medications - No data to display  ED Course  I have reviewed the triage vital signs and the nursing notes.  Pertinent labs & imaging results that were available during my care of the patient were reviewed by me and considered in my medical decision making (see chart for  details).    MDM Rules/Calculators/A&P                      Patient presenting with palpitations, third episode this month.  Initially thought possibly atrial fibrillation RVR, however cardiology  advises this could be a sinus tachycardia.  CBC, BMP, magnesium unremarkable.  However, TSH is undetectable.  Free T4 and T3 are pending.  Plan to treat with either methimazole or PTU.  Cardiology plans to follow-up with the patient outpatient and recommends Cardizem. At shift change, patient care transferred to Martinique Robinson, PA-C for continued evaluation, follow up of free T4 (T3 send out) and determination of disposition. Anticipate discharge with cardiology and PCP follow up.   Final Clinical Impression(s) / ED Diagnoses Final diagnoses:  Palpitations  Low TSH level    Rx / DC Orders ED Discharge Orders    None       Frederica Kuster, PA-C 01/13/19 [place], [place], DO 01/21/19 1633

## 2019-01-14 ENCOUNTER — Telehealth: Payer: Self-pay | Admitting: *Deleted

## 2019-01-14 LAB — T3: T3, Total: 247 ng/dL — ABNORMAL HIGH (ref 71–180)

## 2019-01-14 NOTE — Telephone Encounter (Signed)
[employer] to ship a 14 day cardiac event monitor to the patients home. 

## 2019-01-21 ENCOUNTER — Other Ambulatory Visit: Payer: Self-pay

## 2019-01-21 ENCOUNTER — Ambulatory Visit (HOSPITAL_COMMUNITY): Payer: 59 | Attending: Cardiology

## 2019-01-21 DIAGNOSIS — R002 Palpitations: Secondary | ICD-10-CM | POA: Insufficient documentation

## 2019-01-22 ENCOUNTER — Ambulatory Visit (INDEPENDENT_AMBULATORY_CARE_PROVIDER_SITE_OTHER): Payer: 59 | Admitting: Internal Medicine

## 2019-01-22 ENCOUNTER — Encounter: Payer: Self-pay | Admitting: Internal Medicine

## 2019-01-22 ENCOUNTER — Telehealth: Payer: Self-pay | Admitting: Physician Assistant

## 2019-01-22 VITALS — BP 150/71 | HR 47 | Temp 97.3°F | Ht 68.5 in | Wt 134.0 lb

## 2019-01-22 DIAGNOSIS — E782 Mixed hyperlipidemia: Secondary | ICD-10-CM

## 2019-01-22 DIAGNOSIS — R002 Palpitations: Secondary | ICD-10-CM | POA: Diagnosis not present

## 2019-01-22 DIAGNOSIS — Z8669 Personal history of other diseases of the nervous system and sense organs: Secondary | ICD-10-CM | POA: Diagnosis not present

## 2019-01-22 NOTE — Patient Instructions (Addendum)
Medication Instructions:  Your physician has recommended you make the following change in your medication:  STOP TAKING YOUR DILTIAZEM  START PROPRANOLOL 20 MG. ONE TABLET TWICE A DAY AS NEEDED FOR PALPITATIONS *If you need a refill on your cardiac medications before your next appointment, please call your pharmacy*  Lab Work: NONE If you have labs (blood work) drawn today and your tests are completely normal, you will receive your results only by: Marland Kitchen MyChart Message (if you have MyChart) OR . A paper copy in the mail If you have any lab test that is abnormal or we need to change your treatment, we will call you to review the results.  Testing/Procedures: PLEASE PLACE ON YOUR EVENT MONITOR FOR THE 14-DAY PERIOD  Follow-Up: At [facility], you and your health needs are our priority.  As part of our continuing mission to provide you with exceptional heart care, we have created designated Provider Care Teams.  These Care Teams include your primary Cardiologist (physician) and Advanced Practice Providers (APPs -  Physician Assistants and Nurse Practitioners) who all work together to provide you with the care you need, when you need it.  Your next appointment:   1 month(s)  The format for your next appointment:   In Person  Provider:   Cherlynn Kaiser, MD  Other Instructions   [employer] Cardiac Event Monitor Instructions Your physician has requested you wear your cardiac event monitor for 14 days, (1-30). [employer] may call or text to confirm a shipping address. The monitor will be sent to a land address via UPS. [employer] will not ship a monitor to a PO BOX. It typically takes 3-5 days to receive your monitor after it has been enrolled. [employer] will assist with USPS tracking if your package is delayed. The telephone number for [employer] is [phone]. Once you have received your monitor, please review the enclosed instructions. Instruction tutorials can also be viewed  under help and settings on the enclosed cell phone. Your monitor has already been registered assigning a specific monitor serial # to you.  Applying the monitor Remove cell phone from case and turn it on. The cell phone works as Dealer and needs to be within [employer] of you at all times. The cell phone will need to be charged on a daily basis. We recommend you plug the cell phone into the enclosed charger at your bedside table every night.  Monitor batteries: You will receive two monitor batteries labelled #1 and #2. These are your recorders. Plug battery #2 onto the second connection on the enclosed charger. Keep one battery on the charger at all times. This will keep the monitor battery deactivated. It will also keep it fully charged for when you need to switch your monitor batteries. A small light will be blinking on the battery emblem when it is charging. The light on the battery emblem will remain on when the battery is fully charged.  Open package of a Monitor strip. Insert battery #1 into black hood on strip and gently squeeze monitor battery onto connection as indicated in instruction booklet. Set aside while preparing skin.  Choose location for your strip, vertical or horizontal, as indicated in the instruction booklet. Shave to remove all hair from location. There cannot be any lotions, oils, powders, or colognes on skin where monitor is to be applied. Wipe skin clean with enclosed Saline wipe. Dry skin completely.  Peel paper labeled #1 off the back of the Monitor strip exposing the adhesive. Place the  monitor on the chest in the vertical or horizontal position shown in the instruction booklet. One arrow on the monitor strip must be pointing upward. Carefully remove paper labeled #2, attaching remainder of strip to your skin. Try not to create any folds or wrinkles in the strip as you apply it.  Firmly press and release the circle in the center of the monitor battery.  You will hear a small beep. This is turning the monitor battery on. The heart emblem on the monitor battery will light up every 5 seconds if the monitor battery in turned on and connected to the patient securely. Do not push and hold the circle down as this turns the monitor battery off. The cell phone will locate the monitor battery. A screen will appear on the cell phone checking the connection of your monitor strip. This may read poor connection initially but change to good connection within the next minute. Once your monitor accepts the connection you will hear a series of 3 beeps followed by a climbing crescendo of beeps. A screen will appear on the cell phone showing the two monitor strip placement options. Touch the picture that demonstrates where you applied the monitor strip.  Your monitor strip and battery are waterproof. You are able to shower, bathe, or swim with the monitor on. They just ask you do not submerge deeper than 3 feet underwater. We recommend removing the monitor if you are swimming in a lake, river, or ocean.  Your monitor battery will need to be switched to a fully charged monitor battery approximately once a week. The cell phone will alert you of an action which needs to be made.  On the cell phone, tap for details to reveal connection status, monitor battery status, and cell phone battery status. The green dots indicates your monitor is in good status. A red dot indicates there is something that needs your attention.  To record a symptom, click the circle on the monitor battery. In 30-60 seconds a list of symptoms will appear on the cell phone. Select your symptom and tap save. Your monitor will record a sustained or significant arrhythmia regardless of you clicking the button. Some patients do not feel the heart rhythm irregularities. [employer] will notify us of any serious or critical events.  Refer to instruction booklet for instructions on switching  batteries, changing strips, the Do not disturb or Pause features, or any additional questions.  Call [employer] at [phone], to confirm your monitor is transmitting and record your baseline. They will answer any questions you may have regarding the monitor instructions at that time.  Returning the monitor to [place] all equipment back into blue box. Peel off strip of paper to expose adhesive and close box securely. There is a prepaid UPS shipping label on this box. Drop in a UPS drop box, or at a UPS facility like [employer]. You may also contact [employer] to arrange UPS to pick up monitor package at your home.

## 2019-01-22 NOTE — Telephone Encounter (Signed)
Received a call from the monitor company stating that she was in atrial fibrillation.  Her heart rate is controlled.  This was the baseline telemetry strip, no symptoms reported.  Spoke with Dr. Margaretann Loveless, [id number] is 1 so no treatment changes right now.  She was bradycardic on Cardizem CD 120 mg daily, it was discontinued today.  Continue taking the propanolol as needed.  Left the patient a message saying that the atrial fibrillation has been detected.  No treatment changes, make sure to keep follow-up appointment in the office will call her next week.  Rosaria Ferries, PA-C 01/22/2019 7:29 PM Beeper [phone]

## 2019-01-25 ENCOUNTER — Telehealth: Payer: Self-pay

## 2019-01-25 NOTE — Telephone Encounter (Signed)
Received Cardiac Report form [employer] for 01/22/2019 at 5:15pm. Report Analysis: Atrial Fibrillation Sustained  Pt was contacted 01/22/2019 by Rosaria Ferries PA-C regarding results and telephone message was left on voicemail.   Contacted pt who states she may have had symptoms of lightheadedness and feelings of 'skipped heartbeats' during episode. Pt not currently at home. She states she feels better today and her 'HR feels stable'. Pt states she has not yet taken taken her propranolol.  Advised pt of the following from 1/8 telephone encounter:  "Continue taking the propanolol as needed.  Left the patient a message saying that the atrial fibrillation has been detected.  No treatment changes, make sure to keep follow-up appointment in the office will call her next week.  Rosaria Ferries, PA-C 01/22/2019 7:29 PM"  Also advised to call back if palpitations not relieved by propranolol and call 911 for Sx such as CP and presyncope. Pt verbalized understanding.

## 2019-02-01 ENCOUNTER — Ambulatory Visit (INDEPENDENT_AMBULATORY_CARE_PROVIDER_SITE_OTHER): Payer: 59

## 2019-02-01 DIAGNOSIS — R002 Palpitations: Secondary | ICD-10-CM

## 2019-02-14 NOTE — Progress Notes (Addendum)
Cardiology Office Note:    Date:  01/22/2019   ID:  Carolyn Snyder, DOB September 23, 1955, MRN [id number]  PCP:  Fanny Bien, MD  Cardiologist:  Elouise Munroe, MD  Electrophysiologist:  None   Referring MD: Andria Frames, PA-C   Chief Complaint: Afib  History of Present Illness:    Carolyn Snyder is a 64 y.o. female with a hx of  anxiety, migraine, HLD who is being seen today for the evaluation of palpitations and atrial fibrillation.  I initially met the patient in the emergency department on December 30 for palpitations.  And EMS ECG scanned into the media section of her chart suggests rapid atrial fibrillation.  She has had several episodes of palpitations over the past several months.  She denies episodes of chest pain with palpitations and denies syncope or presyncope.  She was given Cardizem when she was found to be in a rapid narrow complex tachycardia which slowed her rhythm and upon ED evaluation she was in normal sinus rhythm.  Family history significant for her father who had sudden cardiac death from MI at age 38 and mother who had coronary artery disease with PCI but lived into her 9s.  We were using diltiazem for rate control if she were to have recurrent episodes of atrial fibrillation.  She notes significant bradycardia with diltiazem despite being on a low dose of 120 mg daily.  She was prescribed propranolol by her primary care provider but has not started taking this medication yet.  She was noted to have hyperthyroidism with a low TSH, fitting with her symptoms of increased weight loss and new atrial fibrillation.  I have encouraged her to continue to pursue age-related screening.  We reviewed the results of echocardiogram which were normal.  She has a trivial pericardial effusion.  She just received her event monitor in the mail and will be placing this with the help of her friend who is a Marine scientist after our visit today.  Past Medical History:  Diagnosis Date  . Abnormal  Pap smear   . Anxiety   . Hyperlipidemia   . Menopause 2011  . Migraine     Past Surgical History:  Procedure Laterality Date  . cryotheraphy  1988  . KNEE SURGERY  1979   removed hemangioma with calcium deposit  . MOHS SURGERY  2012   skin cancer removed nose  . TEMPOROMANDIBULAR JOINT SURGERY  1990    Current Medications: Current Meds  Medication Sig  . propranolol (INDERAL) 20 MG tablet Take 20 mg by mouth 2 (two) times daily as needed. FOR PALPITATIONS  . [DISCONTINUED] diltiazem (CARDIZEM CD) 120 MG 24 hr capsule Take 1 capsule (120 mg total) by mouth daily.     Allergies:   Penicillins   Social History   Socioeconomic History  . Marital status: Divorced    Spouse name: Not on file  . Number of children: Not on file  . Years of education: Not on file  . Highest education level: Not on file  Occupational History  . Not on file  Tobacco Use  . Smoking status: Never Smoker  . Smokeless tobacco: Never Used  Substance and Sexual Activity  . Alcohol use: Yes    Comment: socially  . Drug use: No  . Sexual activity: Not Currently    Birth control/protection: Post-menopausal  Other Topics Concern  . Not on file  Social History Narrative  . Not on file   Social Determinants of Health  Financial Resource Strain:   . Difficulty of Paying Living Expenses: Not on file  Food Insecurity:   . Worried About Charity fundraiser in the Last Year: Not on file  . Ran Out of Food in the Last Year: Not on file  Transportation Needs:   . Lack of Transportation (Medical): Not on file  . Lack of Transportation (Non-Medical): Not on file  Physical Activity:   . Days of Exercise per Week: Not on file  . Minutes of Exercise per Session: Not on file  Stress:   . Feeling of Stress : Not on file  Social Connections:   . Frequency of Communication with Friends and Family: Not on file  . Frequency of Social Gatherings with Friends and Family: Not on file  . Attends Religious  Services: Not on file  . Active Member of Clubs or Organizations: Not on file  . Attends Archivist Meetings: Not on file  . Marital Status: Not on file     Family History: The patient's family history includes Heart disease in her father and mother; Stroke in her maternal grandmother and mother.  ROS:   Please see the history of present illness.    All other systems reviewed and are negative.  EKGs/Labs/Other Studies Reviewed:    The following studies were reviewed today:  EKG: Sinus bradycardia, PAC, rate 47 bpm.  Recent Labs: 01/13/2019: ALT 38; BUN 17; Creatinine, Ser 0.48; Hemoglobin 12.3; Magnesium 2.1; Platelets 337; Potassium 4.2; Sodium 142; TSH <0.010  Recent Lipid Panel    Component Value Date/Time   CHOL 235 (H) 06/17/2011 1112   TRIG 155 (H) 06/17/2011 1112   HDL 58 06/17/2011 1112   CHOLHDL 4.1 06/17/2011 1112   VLDL 31 06/17/2011 1112   LDLCALC 146 (H) 06/17/2011 1112    Physical Exam:    VS:  BP (!) 150/71   Pulse (!) 47   Temp (!) 97.3 F (36.3 C)   Ht 5' 8.5" (1.74 m)   Wt 134 lb (60.8 kg)   SpO2 98%   BMI 20.08 kg/m     Wt Readings from Last 5 Encounters:  01/22/19 134 lb (60.8 kg)  07/10/13 134 lb 3.2 oz (60.9 kg)  08/21/12 137 lb 12.8 oz (62.5 kg)  10/10/11 131 lb (59.4 kg)  09/17/11 128 lb (58.1 kg)     Constitutional: No acute distress Eyes: sclera non-icteric, normal conjunctiva and lids ENMT: normal dentition, moist mucous membranes Cardiovascular: regular rhythm, normal rate, no murmurs. S1 and S2 normal. Radial pulses normal bilaterally. No jugular venous distention.  Respiratory: clear to auscultation bilaterally GI : normal bowel sounds, soft and nontender. No distention.   MSK: extremities warm, well perfused. No edema.  NEURO: grossly nonfocal exam, moves all extremities. PSYCH: alert and oriented x 3, normal mood and affect.      ASSESSMENT:    1. Palpitations   2. Mixed hyperlipidemia   3. History of  migraine headaches    PLAN:    Palpitations-given that we have not EMS ECG suggestive of atrial fibrillation, I would like to complete her cardiac monitor to ensure that her narrow complex tachycardia in fact represents atrial fibrillation and not SVT or alternate narrow complex tachycardia.  Fortunately she has not had concerning symptoms of presyncope or syncope and is tolerating rate control well.  She is however fairly bradycardic on diltiazem, so we will stop that today.  She was prescribed propranolol by her primary care physician, it would be  reasonable for her to take that at this time and monitor for further bradycardia.  Her blood pressure is adequate on rate control medicines.  Hyperlipidemia-patient previously taking Crestor 5 mg daily, I would recommend continued statin therapy for primary prevention of CAD with a family history of CAD.  We need to repeat a lipid panel.  Daytime fatigue - needs a referral for sleep study.   I will see the patient back in follow-up after monitor results are available.  Total time of encounter: 30 minutes total time of encounter, including 20 minutes spent in face-to-face patient care. This time includes coordination of care and counseling regarding above issues. Remainder of non-face-to-face time involved reviewing chart documents/testing relevant to the patient encounter and documentation in the medical record.  Cherlynn Kaiser, MD [place]  [facility]   Medication Adjustments/Labs and Tests Ordered: Current medicines are reviewed at length with the patient today.  Concerns regarding medicines are outlined above.  Orders Placed This Encounter  Procedures  . EKG 12-Lead   No orders of the defined types were placed in this encounter.   Patient Instructions  Medication Instructions:  Your physician has recommended you make the following change in your medication:  STOP TAKING YOUR DILTIAZEM  START PROPRANOLOL 20 MG. ONE TABLET TWICE  A DAY AS NEEDED FOR PALPITATIONS *If you need a refill on your cardiac medications before your next appointment, please call your pharmacy*  Lab Work: NONE If you have labs (blood work) drawn today and your tests are completely normal, you will receive your results only by: Marland Kitchen MyChart Message (if you have MyChart) OR . A paper copy in the mail If you have any lab test that is abnormal or we need to change your treatment, we will call you to review the results.  Testing/Procedures: PLEASE PLACE ON YOUR EVENT MONITOR FOR THE 14-DAY PERIOD  Follow-Up: At [facility], you and your health needs are our priority.  As part of our continuing mission to provide you with exceptional heart care, we have created designated Provider Care Teams.  These Care Teams include your primary Cardiologist (physician) and Advanced Practice Providers (APPs -  Physician Assistants and Nurse Practitioners) who all work together to provide you with the care you need, when you need it.  Your next appointment:   1 month(s)  The format for your next appointment:   In Person  Provider:   Cherlynn Kaiser, MD  Other Instructions   [employer] Cardiac Event Monitor Instructions Your physician has requested you wear your cardiac event monitor for 14 days, (1-30). [employer] may call or text to confirm a shipping address. The monitor will be sent to a land address via UPS. [employer] will not ship a monitor to a PO BOX. It typically takes 3-5 days to receive your monitor after it has been enrolled. [employer] will assist with USPS tracking if your package is delayed. The telephone number for [employer] is [phone]. Once you have received your monitor, please review the enclosed instructions. Instruction tutorials can also be viewed under help and settings on the enclosed cell phone. Your monitor has already been registered assigning a specific monitor serial # to you.  Applying the monitor Remove cell phone  from case and turn it on. The cell phone works as Dealer and needs to be within [employer] of you at all times. The cell phone will need to be charged on a daily basis. We recommend you plug the cell phone into the enclosed  charger at your bedside table every night.  Monitor batteries: You will receive two monitor batteries labelled #1 and #2. These are your recorders. Plug battery #2 onto the second connection on the enclosed charger. Keep one battery on the charger at all times. This will keep the monitor battery deactivated. It will also keep it fully charged for when you need to switch your monitor batteries. A small light will be blinking on the battery emblem when it is charging. The light on the battery emblem will remain on when the battery is fully charged.  Open package of a Monitor strip. Insert battery #1 into black hood on strip and gently squeeze monitor battery onto connection as indicated in instruction booklet. Set aside while preparing skin.  Choose location for your strip, vertical or horizontal, as indicated in the instruction booklet. Shave to remove all hair from location. There cannot be any lotions, oils, powders, or colognes on skin where monitor is to be applied. Wipe skin clean with enclosed Saline wipe. Dry skin completely.  Peel paper labeled #1 off the back of the Monitor strip exposing the adhesive. Place the monitor on the chest in the vertical or horizontal position shown in the instruction booklet. One arrow on the monitor strip must be pointing upward. Carefully remove paper labeled #2, attaching remainder of strip to your skin. Try not to create any folds or wrinkles in the strip as you apply it.  Firmly press and release the circle in the center of the monitor battery. You will hear a small beep. This is turning the monitor battery on. The heart emblem on the monitor battery will light up every 5 seconds if the monitor battery in turned on and  connected to the patient securely. Do not push and hold the circle down as this turns the monitor battery off. The cell phone will locate the monitor battery. A screen will appear on the cell phone checking the connection of your monitor strip. This may read poor connection initially but change to good connection within the next minute. Once your monitor accepts the connection you will hear a series of 3 beeps followed by a climbing crescendo of beeps. A screen will appear on the cell phone showing the two monitor strip placement options. Touch the picture that demonstrates where you applied the monitor strip.  Your monitor strip and battery are waterproof. You are able to shower, bathe, or swim with the monitor on. They just ask you do not submerge deeper than 3 feet underwater. We recommend removing the monitor if you are swimming in a lake, river, or ocean.  Your monitor battery will need to be switched to a fully charged monitor battery approximately once a week. The cell phone will alert you of an action which needs to be made.  On the cell phone, tap for details to reveal connection status, monitor battery status, and cell phone battery status. The green dots indicates your monitor is in good status. A red dot indicates there is something that needs your attention.  To record a symptom, click the circle on the monitor battery. In 30-60 seconds a list of symptoms will appear on the cell phone. Select your symptom and tap save. Your monitor will record a sustained or significant arrhythmia regardless of you clicking the button. Some patients do not feel the heart rhythm irregularities. [employer] will notify us of any serious or critical events.  Refer to instruction booklet for instructions on switching batteries, changing strips, the Do  not disturb or Pause features, or any additional questions.  Call [employer] at [phone], to confirm your monitor is transmitting and record  your baseline. They will answer any questions you may have regarding the monitor instructions at that time.  Returning the monitor to [place] all equipment back into blue box. Peel off strip of paper to expose adhesive and close box securely. There is a prepaid UPS shipping label on this box. Drop in a UPS drop box, or at a UPS facility like [employer]. You may also contact [employer] to arrange UPS to pick up monitor package at your home.

## 2019-02-22 ENCOUNTER — Other Ambulatory Visit: Payer: Self-pay

## 2019-02-22 ENCOUNTER — Ambulatory Visit (INDEPENDENT_AMBULATORY_CARE_PROVIDER_SITE_OTHER): Payer: 59 | Admitting: Internal Medicine

## 2019-02-22 ENCOUNTER — Encounter: Payer: Self-pay | Admitting: Internal Medicine

## 2019-02-22 VITALS — BP 134/72 | HR 76 | Temp 97.3°F | Ht 68.0 in | Wt 139.8 lb

## 2019-02-22 DIAGNOSIS — I493 Ventricular premature depolarization: Secondary | ICD-10-CM | POA: Diagnosis not present

## 2019-02-22 DIAGNOSIS — I480 Paroxysmal atrial fibrillation: Secondary | ICD-10-CM | POA: Diagnosis not present

## 2019-02-22 DIAGNOSIS — R002 Palpitations: Secondary | ICD-10-CM | POA: Diagnosis not present

## 2019-02-22 DIAGNOSIS — E782 Mixed hyperlipidemia: Secondary | ICD-10-CM

## 2019-02-22 DIAGNOSIS — R001 Bradycardia, unspecified: Secondary | ICD-10-CM

## 2019-02-22 NOTE — Progress Notes (Signed)
Cardiology Office Note:    Date:  02/22/2019   ID:  Carolyn Snyder, DOB 1955-05-18, MRN [id number]  PCP:  Fanny Bien, MD  Cardiologist:  Elouise Munroe, MD  Electrophysiologist:  None   Referring MD: Fanny Bien, MD   Chief Complaint: f/u afib  History of Present Illness:    Carolyn Snyder is a 64 y.o. female with a history of anxiety, migraine, HLDwho is being seen today for the evaluation ofpalpitationsand atrial fibrillation.   We attempted to use diltiazem for rate control, however she had symptomatic bradycardia, and this was discontinued at our last visit. She felt much better afterward.   She wore the monitor after discontinuing rate control, but continued to have sinus bradycardia. Monitor results discussed today. Wore monitor for 2 weeks. Afib, sinus brady, and PVCs. Symptoms seem to correlate best with episodes of PVCs. We discussed that it may be difficult to treat PVCs with underlying bradycardia.   She tells me that on Jan 14 -  She felt "fluttery" after a beer, and Jan 20 - champagne, felt "fluttery: again. We discussed that alcohol may be a trigger for her ectopy and afib. She has had no alcohol since or red meat. No fluttering sensation in chest after stopping these potential triggers she has identified. She denies symptoms since 1/22, since her monitor was removed.  Past Medical History:  Diagnosis Date  . Abnormal Pap smear   . Anxiety   . Hyperlipidemia   . Menopause 2011  . Migraine     Past Surgical History:  Procedure Laterality Date  . cryotheraphy  1988  . KNEE SURGERY  1979   removed hemangioma with calcium deposit  . MOHS SURGERY  2012   skin cancer removed nose  . TEMPOROMANDIBULAR JOINT SURGERY  1990    Current Medications: No outpatient medications have been marked as taking for the 02/22/19 encounter (Office Visit) with Elouise Munroe, MD.     Allergies:   Penicillins   Social History   Socioeconomic History  . Marital  status: Divorced    Spouse name: Not on file  . Number of children: Not on file  . Years of education: Not on file  . Highest education level: Not on file  Occupational History  . Not on file  Tobacco Use  . Smoking status: Never Smoker  . Smokeless tobacco: Never Used  Substance and Sexual Activity  . Alcohol use: Yes    Comment: socially  . Drug use: No  . Sexual activity: Not Currently    Birth control/protection: Post-menopausal  Other Topics Concern  . Not on file  Social History Narrative  . Not on file   Social Determinants of Health   Financial Resource Strain:   . Difficulty of Paying Living Expenses: Not on file  Food Insecurity:   . Worried About Charity fundraiser in the Last Year: Not on file  . Ran Out of Food in the Last Year: Not on file  Transportation Needs:   . Lack of Transportation (Medical): Not on file  . Lack of Transportation (Non-Medical): Not on file  Physical Activity:   . Days of Exercise per Week: Not on file  . Minutes of Exercise per Session: Not on file  Stress:   . Feeling of Stress : Not on file  Social Connections:   . Frequency of Communication with Friends and Family: Not on file  . Frequency of Social Gatherings with Friends and Family:  Not on file  . Attends Religious Services: Not on file  . Active Member of Clubs or Organizations: Not on file  . Attends Archivist Meetings: Not on file  . Marital Status: Not on file     Family History: The patient's family history includes Heart disease in her father and mother; Stroke in her maternal grandmother and mother.  ROS:   Please see the history of present illness.    All other systems reviewed and are negative.  EKGs/Labs/Other Studies Reviewed:    The following studies were reviewed today:  EKG:  Not performed today.  Recent Labs: 01/13/2019: ALT 38; BUN 17; Creatinine, Ser 0.48; Hemoglobin 12.3; Magnesium 2.1; Platelets 337; Potassium 4.2; Sodium 142; TSH  <0.010  Recent Lipid Panel    Component Value Date/Time   CHOL 235 (H) 06/17/2011 1112   TRIG 155 (H) 06/17/2011 1112   HDL 58 06/17/2011 1112   CHOLHDL 4.1 06/17/2011 1112   VLDL 31 06/17/2011 1112   LDLCALC 146 (H) 06/17/2011 1112    Physical Exam:    VS:  BP 134/72   Pulse 76   Temp (!) 97.3 F (36.3 C)   Ht 5\' 8"  (1.727 m)   Wt 139 lb 12.8 oz (63.4 kg)   SpO2 99%   BMI 21.26 kg/m     Wt Readings from Last 5 Encounters:  02/22/19 139 lb 12.8 oz (63.4 kg)  01/22/19 134 lb (60.8 kg)  07/10/13 134 lb 3.2 oz (60.9 kg)  08/21/12 137 lb 12.8 oz (62.5 kg)  10/10/11 131 lb (59.4 kg)     Constitutional: No acute distress Eyes: sclera non-icteric, normal conjunctiva and lids ENMT: normal dentition, moist mucous membranes Cardiovascular: regular rhythm, normal rate, no murmurs. S1 and S2 normal. Radial pulses normal bilaterally. No jugular venous distention.  Respiratory: clear to auscultation bilaterally GI : normal bowel sounds, soft and nontender. No distention.   MSK: extremities warm, well perfused. No edema.  NEURO: grossly nonfocal exam, moves all extremities. PSYCH: alert and oriented x 3, normal mood and affect.   ASSESSMENT:    1. Paroxysmal atrial fibrillation (HCC)   2. Palpitations   3. Mixed hyperlipidemia   4. PVC's (premature ventricular contractions)   5. Sinus bradycardia    PLAN:    PAF - chadsvasc is 1, and afib may not be what is causing her symptoms based on monitor results. Attempted rate control however she had symptomatic sinus bradycardia. We discussed observing symptoms off of alcohol and any other identifiable triggers for her, and we will revisit in 1 mo. We will consider EP consultation if needed, possible to discuss flecanide or other strategy to mitigate symptoms from PVCs. We discussed that this is not required as PVC burden is low and EF is normal. No AC for chadsvasc 1.   Sinus bradycardia - avoid Av nodal blocking agents.    Hyperlipidemia - Diet and lifestyle modification discussed and patient will pursue. LDL 116, Trigs 163. Will discuss optimizing with statin therapy at next appointment.   Hyperthyroidism - will follow up labs with PCP as this may be contributing to afib and PVCS.   Total time of encounter: 30 minutes total time of encounter, including 20 minutes spent in face-to-face patient care. This time includes coordination of care and counseling regarding above mentioned problem list. Remainder of non-face-to-face time involved reviewing chart documents/testing relevant to the patient encounter and documentation in the medical record. I have independently reviewed documentation from referring provider.  Cherlynn Kaiser, MD [place]  [facility]    Medication Adjustments/Labs and Tests Ordered: Current medicines are reviewed at length with the patient today.  Concerns regarding medicines are outlined above.  No orders of the defined types were placed in this encounter.  No orders of the defined types were placed in this encounter.   There are no Patient Instructions on file for this visit.

## 2019-02-22 NOTE — Patient Instructions (Signed)
Medication Instructions:  NO CHANGES *If you need a refill on your cardiac medications before your next appointment, please call your pharmacy*  Lab Work: NOT NEEDED  Testing/Procedures: NOT NEEDED  Follow-Up: At [facility], you and your health needs are our priority.  As part of our continuing mission to provide you with exceptional heart care, we have created designated Provider Care Teams.  These Care Teams include your primary Cardiologist (physician) and Advanced Practice Providers (APPs -  Physician Assistants and Nurse Practitioners) who all work together to provide you with the care you need, when you need it.  Your next appointment:   1 month(s)  The format for your next appointment:   In Person  Provider:   Cherlynn Kaiser, MD  Other Instructions N/A

## 2019-03-01 ENCOUNTER — Telehealth: Payer: Self-pay | Admitting: Internal Medicine

## 2019-03-01 NOTE — Telephone Encounter (Signed)
New Message:   Pt said she received her Thyroid results from her primary doctor. She was told to call here and give to Dr Margaretann Loveless or her nurse and discuss it.

## 2019-03-01 NOTE — Telephone Encounter (Signed)
Spoke to patient -  Patient states she had labs drawn  02/26/19   TSH- 1.800 T3 - 93 T4 - 4.8 Patient states Dr Margaretann Loveless wanted to know the results . Patient has an appointment in March 29, 2019 to follow up .  will contact with any cahnges

## 2019-03-29 ENCOUNTER — Encounter: Payer: Self-pay | Admitting: Internal Medicine

## 2019-03-29 ENCOUNTER — Ambulatory Visit (INDEPENDENT_AMBULATORY_CARE_PROVIDER_SITE_OTHER): Payer: 59 | Admitting: Internal Medicine

## 2019-03-29 ENCOUNTER — Other Ambulatory Visit: Payer: Self-pay

## 2019-03-29 VITALS — BP 140/84 | HR 81 | Temp 97.2°F | Ht 68.0 in | Wt 140.4 lb

## 2019-03-29 DIAGNOSIS — R001 Bradycardia, unspecified: Secondary | ICD-10-CM

## 2019-03-29 DIAGNOSIS — I493 Ventricular premature depolarization: Secondary | ICD-10-CM | POA: Diagnosis not present

## 2019-03-29 DIAGNOSIS — R002 Palpitations: Secondary | ICD-10-CM | POA: Diagnosis not present

## 2019-03-29 DIAGNOSIS — I480 Paroxysmal atrial fibrillation: Secondary | ICD-10-CM | POA: Diagnosis not present

## 2019-03-29 DIAGNOSIS — Z8669 Personal history of other diseases of the nervous system and sense organs: Secondary | ICD-10-CM

## 2019-03-29 DIAGNOSIS — E0590 Thyrotoxicosis, unspecified without thyrotoxic crisis or storm: Secondary | ICD-10-CM

## 2019-03-29 DIAGNOSIS — E782 Mixed hyperlipidemia: Secondary | ICD-10-CM

## 2019-03-29 NOTE — Progress Notes (Signed)
Cardiology Office Note:    Date:  03/29/2019   ID:  Carolyn Snyder, DOB Oct 11, 1955, MRN [id number]  PCP:  Fanny Bien, MD  Cardiologist:  Elouise Munroe, MD  Electrophysiologist:  None   Referring MD: Fanny Bien, MD   Chief Complaint: f/u afib  History of Present Illness:    Carolyn Snyder is a 64 y.o. female with a history of anxiety, migraine, HLDwho is being seen today for the evaluation ofpalpitationsand atrial fibrillation. We attempted to use diltiazem for rate control, however she had symptomatic bradycardia, and this was discontinued at our last visit. She felt much better afterward. She wore the monitor after discontinuing rate control, but continued to have sinus bradycardia. Wore monitor for 2 weeks. Afib, sinus brady, and PVCs. Symptoms seem to correlate best with episodes of PVCs. We discussed that it may be difficult to treat PVCs with underlying bradycardia.   She is overall feeling well, and not currently on a rate control strategy. No significant episodes since last visit. We decided on a strategy of observation at this time. Continuing to focus on diet and lifestyle modification. We discussed Mediterranean diet. She was started on crestor 5 mg daily by PCP.   Past Medical History:  Diagnosis Date  . Abnormal Pap smear   . Anxiety   . Hyperlipidemia   . Menopause 2011  . Migraine     Past Surgical History:  Procedure Laterality Date  . cryotheraphy  1988  . KNEE SURGERY  1979   removed hemangioma with calcium deposit  . MOHS SURGERY  2012   skin cancer removed nose  . TEMPOROMANDIBULAR JOINT SURGERY  1990    Current Medications: Current Meds  Medication Sig  . rosuvastatin (CRESTOR) 5 MG tablet Take 5 mg by mouth daily.     Allergies:   Penicillins   Social History   Socioeconomic History  . Marital status: Divorced    Spouse name: Not on file  . Number of children: Not on file  . Years of education: Not on file  . Highest education  level: Not on file  Occupational History  . Not on file  Tobacco Use  . Smoking status: Never Smoker  . Smokeless tobacco: Never Used  Substance and Sexual Activity  . Alcohol use: Yes    Comment: socially  . Drug use: No  . Sexual activity: Not Currently    Birth control/protection: Post-menopausal  Other Topics Concern  . Not on file  Social History Narrative  . Not on file   Social Determinants of Health   Financial Resource Strain:   . Difficulty of Paying Living Expenses:   Food Insecurity:   . Worried About Charity fundraiser in the Last Year:   . Arboriculturist in the Last Year:   Transportation Needs:   . Film/video editor (Medical):   Marland Kitchen Lack of Transportation (Non-Medical):   Physical Activity:   . Days of Exercise per Week:   . Minutes of Exercise per Session:   Stress:   . Feeling of Stress :   Social Connections:   . Frequency of Communication with Friends and Family:   . Frequency of Social Gatherings with Friends and Family:   . Attends Religious Services:   . Active Member of Clubs or Organizations:   . Attends Archivist Meetings:   Marland Kitchen Marital Status:      Family History: The patient's family history includes Heart disease in  her father and mother; Stroke in her maternal grandmother and mother.  ROS:   Please see the history of present illness.    All other systems reviewed and are negative.  EKGs/Labs/Other Studies Reviewed:    The following studies were reviewed today:  EKG:  Not obtained today  Recent Labs: 01/13/2019: ALT 38; BUN 17; Creatinine, Ser 0.48; Hemoglobin 12.3; Magnesium 2.1; Platelets 337; Potassium 4.2; Sodium 142; TSH <0.010  Recent Lipid Panel    Component Value Date/Time   CHOL 235 (H) 06/17/2011 1112   TRIG 155 (H) 06/17/2011 1112   HDL 58 06/17/2011 1112   CHOLHDL 4.1 06/17/2011 1112   VLDL 31 06/17/2011 1112   LDLCALC 146 (H) 06/17/2011 1112    Physical Exam:    VS:  BP 140/84   Pulse 81    Temp (!) 97.2 F (36.2 C)   Ht 5\' 8"  (1.727 m)   Wt 140 lb 6.4 oz (63.7 kg)   SpO2 97%   BMI 21.35 kg/m     Wt Readings from Last 5 Encounters:  03/29/19 140 lb 6.4 oz (63.7 kg)  02/22/19 139 lb 12.8 oz (63.4 kg)  01/22/19 134 lb (60.8 kg)  07/10/13 134 lb 3.2 oz (60.9 kg)  08/21/12 137 lb 12.8 oz (62.5 kg)     Constitutional: No acute distress Eyes: sclera non-icteric, normal conjunctiva and lids ENMT: normal dentition, moist mucous membranes Cardiovascular: regular rhythm, bradycardic rate, no murmurs. S1 and S2 normal. Radial pulses normal bilaterally. No jugular venous distention.  Respiratory: clear to auscultation bilaterally GI : normal bowel sounds, soft and nontender. No distention.   MSK: extremities warm, well perfused. No edema.  NEURO: grossly nonfocal exam, moves all extremities. PSYCH: alert and oriented x 3, normal mood and affect.   ASSESSMENT:    1. Paroxysmal atrial fibrillation (HCC)   2. Palpitations   3. PVC's (premature ventricular contractions)   4. Sinus bradycardia   5. Mixed hyperlipidemia   6. History of migraine headaches   7. Hyperthyroidism    PLAN:    PAF - chadsvasc is 1, and afib may not be what is causing her symptoms based on monitor results. Attempted rate control however she had symptomatic sinus bradycardia.We have decided to defer EP evaluation, given that she is feeling well and working on diet and lifestyle modifications to help with symptoms. No AC for chadsvasc 1, discussed in detail and participated in shared decision making. Review over time as she will have an additional risk factor at age 18.   Sinus bradycardia - avoid Av nodal blocking agents.   Hyperlipidemia -  Continue diet and lifestyle modification. Crestor 5 mg daily. Labs in July with PCP will be fowarded to our office.   Hyperthyroidism - follow up labs normalizing. Continue to follow with PCP. TSH- 1.800 T3 - 93 T4 - 4.8  Total time of encounter: 30  minutes total time of encounter, including 18 minutes spent in face-to-face patient care. This time includes coordination of care and counseling regarding above mentioned problem list. Remainder of non-face-to-face time involved reviewing chart documents/testing relevant to the patient encounter and documentation in the medical record. I have independently reviewed documentation from referring provider.   Cherlynn Kaiser, MD   [facility]    Medication Adjustments/Labs and Tests Ordered: Current medicines are reviewed at length with the patient today.  Concerns regarding medicines are outlined above.  No orders of the defined types were placed in this encounter.  No orders of  the defined types were placed in this encounter.   Patient Instructions  Medication Instructions:  No changes  *If you need a refill on your cardiac medications before your next appointment, please call your pharmacy*   Lab Work: Not needed Testing/Procedures: Not needed   Follow-Up: At [facility], you and your health needs are our priority.  As part of our continuing mission to provide you with exceptional heart care, we have created designated Provider Care Teams.  These Care Teams include your primary Cardiologist (physician) and Advanced Practice Providers (APPs -  Physician Assistants and Nurse Practitioners) who all work together to provide you with the care you need, when you need it.   Your next appointment:   6 month(s)  The format for your next appointment:   In Person  Provider:    Cherlynn Kaiser, MD   Other Instructions  try the mediterranean diet     Mediterranean Diet A Mediterranean diet refers to food and lifestyle choices that are based on the traditions of countries located on the [place]. This way of eating has been shown to help prevent certain conditions and improve outcomes for people who have chronic diseases, like kidney disease and heart  disease. What are tips for following this plan? Lifestyle  Cook and eat meals together with your family, when possible.  Drink enough fluid to keep your urine clear or pale yellow.  Be physically active every day. This includes: ? Aerobic exercise like running or swimming. ? Leisure activities like gardening, walking, or housework.  Get 7-8 hours of sleep each night.  If recommended by your health care provider, drink red wine in moderation. This means 1 glass a day for nonpregnant women and 2 glasses a day for men. A glass of wine equals 5 oz (150 mL). Reading food labels   Check the serving size of packaged foods. For foods such as rice and pasta, the serving size refers to the amount of cooked product, not dry.  Check the total fat in packaged foods. Avoid foods that have saturated fat or trans fats.  Check the ingredients list for added sugars, such as corn syrup. Shopping  At the grocery store, buy most of your food from the areas near the walls of the store. This includes: ? Fresh fruits and vegetables (produce). ? Grains, beans, nuts, and seeds. Some of these may be available in unpackaged forms or large amounts (in bulk). ? Fresh seafood. ? Poultry and eggs. ? Low-fat dairy products.  Buy whole ingredients instead of prepackaged foods.  Buy fresh fruits and vegetables in-season from local farmers markets.  Buy frozen fruits and vegetables in resealable bags.  If you do not have access to quality fresh seafood, buy precooked frozen shrimp or canned fish, such as tuna, salmon, or sardines.  Buy small amounts of raw or cooked vegetables, salads, or olives from the deli or salad bar at your store.  Stock your pantry so you always have certain foods on hand, such as olive oil, canned tuna, canned tomatoes, rice, pasta, and beans. Cooking  Cook foods with extra-virgin olive oil instead of using butter or other vegetable oils.  Have meat as a side dish, and have  vegetables or grains as your main dish. This means having meat in small portions or adding small amounts of meat to foods like pasta or stew.  Use beans or vegetables instead of meat in common dishes like chili or lasagna.  Experiment with different cooking methods. Try  roasting or broiling vegetables instead of steaming or sauteing them.  Add frozen vegetables to soups, stews, pasta, or rice.  Add nuts or seeds for added healthy fat at each meal. You can add these to yogurt, salads, or vegetable dishes.  Marinate fish or vegetables using olive oil, lemon juice, garlic, and fresh herbs. Meal planning   Plan to eat 1 vegetarian meal one day each week. Try to work up to 2 vegetarian meals, if possible.  Eat seafood 2 or more times a week.  Have healthy snacks readily available, such as: ? Vegetable sticks with hummus. ? [place] yogurt. ? Fruit and nut trail mix.  Eat balanced meals throughout the week. This includes: ? Fruit: 2-3 servings a day ? Vegetables: 4-5 servings a day ? Low-fat dairy: 2 servings a day ? Fish, poultry, or lean meat: 1 serving a day ? Beans and legumes: 2 or more servings a week ? Nuts and seeds: 1-2 servings a day ? Whole grains: 6-8 servings a day ? Extra-virgin olive oil: 3-4 servings a day  Limit red meat and sweets to only a few servings a month What are my food choices?  Mediterranean diet ? Recommended  Grains: Whole-grain pasta. Brown rice. Bulgar wheat. Polenta. Couscous. Whole-wheat bread. [place].  Vegetables: Artichokes. Beets. Broccoli. Cabbage. Carrots. Eggplant. Green beans. Chard. Kale. Spinach. Onions. Leeks. Peas. Squash. Tomatoes. Peppers. Radishes.  Fruits: Apples. Apricots. Avocado. Berries. Bananas. Cherries. Dates. Figs. Grapes. Lemons. Melon. Oranges. Peaches. Plums. Pomegranate.  Meats and other protein foods: Beans. Almonds. Sunflower seeds. Pine nuts. Peanuts. [place]. Salmon. Scallops. Shrimp. [place]. Tilapia. Clams.  Oysters. Eggs.  Dairy: Low-fat milk. Cheese. Greek yogurt.  Beverages: Water. Red wine. Herbal tea.  Fats and oils: Extra virgin olive oil. Avocado oil. Grape seed oil.  Sweets and desserts: [place] yogurt with honey. Baked apples. Poached pears. Trail mix.  Seasoning and other foods: Basil. Cilantro. Coriander. Cumin. Mint. Parsley. Sage. Rosemary. Tarragon. Garlic. Oregano. Thyme. Pepper. Balsalmic vinegar. Tahini. Hummus. Tomato sauce. Olives. Mushrooms. ? Limit these  Grains: Prepackaged pasta or rice dishes. Prepackaged cereal with added sugar.  Vegetables: Deep fried potatoes (french fries).  Fruits: Fruit canned in syrup.  Meats and other protein foods: Beef. Pork. Lamb. Poultry with skin. Hot dogs. Berniece Salines.  Dairy: Ice cream. Sour cream. Whole milk.  Beverages: Juice. Sugar-sweetened soft drinks. Beer. Liquor and spirits.  Fats and oils: Butter. Canola oil. Vegetable oil. Beef fat (tallow). Lard.  Sweets and desserts: Cookies. Cakes. Pies. Candy.  Seasoning and other foods: Mayonnaise. Premade sauces and marinades. The items listed may not be a complete list. Talk with your dietitian about what dietary choices are right for you. Summary  The Mediterranean diet includes both food and lifestyle choices.  Eat a variety of fresh fruits and vegetables, beans, nuts, seeds, and whole grains.  Limit the amount of red meat and sweets that you eat.  Talk with your health care provider about whether it is safe for you to drink red wine in moderation. This means 1 glass a day for nonpregnant women and 2 glasses a day for men. A glass of wine equals 5 oz (150 mL). This information is not intended to replace advice given to you by your health care provider. Make sure you discuss any questions you have with your health care provider. Document Revised: 08/31/2015 Document Reviewed: 08/24/2015 Elsevier Patient Education  [place].

## 2019-03-29 NOTE — Patient Instructions (Signed)
Medication Instructions:  No changes  *If you need a refill on your cardiac medications before your next appointment, please call your pharmacy*   Lab Work: Not needed Testing/Procedures: Not needed   Follow-Up: At [facility], you and your health needs are our priority.  As part of our continuing mission to provide you with exceptional heart care, we have created designated Provider Care Teams.  These Care Teams include your primary Cardiologist (physician) and Advanced Practice Providers (APPs -  Physician Assistants and Nurse Practitioners) who all work together to provide you with the care you need, when you need it.   Your next appointment:   6 month(s)  The format for your next appointment:   In Person  Provider:    Cherlynn Kaiser, MD   Other Instructions  try the mediterranean diet     Mediterranean Diet A Mediterranean diet refers to food and lifestyle choices that are based on the traditions of countries located on the [place]. This way of eating has been shown to help prevent certain conditions and improve outcomes for people who have chronic diseases, like kidney disease and heart disease. What are tips for following this plan? Lifestyle  Cook and eat meals together with your family, when possible.  Drink enough fluid to keep your urine clear or pale yellow.  Be physically active every day. This includes: ? Aerobic exercise like running or swimming. ? Leisure activities like gardening, walking, or housework.  Get 7-8 hours of sleep each night.  If recommended by your health care provider, drink red wine in moderation. This means 1 glass a day for nonpregnant women and 2 glasses a day for men. A glass of wine equals 5 oz (150 mL). Reading food labels   Check the serving size of packaged foods. For foods such as rice and pasta, the serving size refers to the amount of cooked product, not dry.  Check the total fat in packaged foods. Avoid  foods that have saturated fat or trans fats.  Check the ingredients list for added sugars, such as corn syrup. Shopping  At the grocery store, buy most of your food from the areas near the walls of the store. This includes: ? Fresh fruits and vegetables (produce). ? Grains, beans, nuts, and seeds. Some of these may be available in unpackaged forms or large amounts (in bulk). ? Fresh seafood. ? Poultry and eggs. ? Low-fat dairy products.  Buy whole ingredients instead of prepackaged foods.  Buy fresh fruits and vegetables in-season from local farmers markets.  Buy frozen fruits and vegetables in resealable bags.  If you do not have access to quality fresh seafood, buy precooked frozen shrimp or canned fish, such as tuna, salmon, or sardines.  Buy small amounts of raw or cooked vegetables, salads, or olives from the deli or salad bar at your store.  Stock your pantry so you always have certain foods on hand, such as olive oil, canned tuna, canned tomatoes, rice, pasta, and beans. Cooking  Cook foods with extra-virgin olive oil instead of using butter or other vegetable oils.  Have meat as a side dish, and have vegetables or grains as your main dish. This means having meat in small portions or adding small amounts of meat to foods like pasta or stew.  Use beans or vegetables instead of meat in common dishes like chili or lasagna.  Experiment with different cooking methods. Try roasting or broiling vegetables instead of steaming or sauteing them.  Add frozen vegetables  to soups, stews, pasta, or rice.  Add nuts or seeds for added healthy fat at each meal. You can add these to yogurt, salads, or vegetable dishes.  Marinate fish or vegetables using olive oil, lemon juice, garlic, and fresh herbs. Meal planning   Plan to eat 1 vegetarian meal one day each week. Try to work up to 2 vegetarian meals, if possible.  Eat seafood 2 or more times a week.  Have healthy snacks readily  available, such as: ? Vegetable sticks with hummus. ? [place] yogurt. ? Fruit and nut trail mix.  Eat balanced meals throughout the week. This includes: ? Fruit: 2-3 servings a day ? Vegetables: 4-5 servings a day ? Low-fat dairy: 2 servings a day ? Fish, poultry, or lean meat: 1 serving a day ? Beans and legumes: 2 or more servings a week ? Nuts and seeds: 1-2 servings a day ? Whole grains: 6-8 servings a day ? Extra-virgin olive oil: 3-4 servings a day  Limit red meat and sweets to only a few servings a month What are my food choices?  Mediterranean diet ? Recommended  Grains: Whole-grain pasta. Brown rice. Bulgar wheat. Polenta. Couscous. Whole-wheat bread. [place].  Vegetables: Artichokes. Beets. Broccoli. Cabbage. Carrots. Eggplant. Green beans. Chard. Kale. Spinach. Onions. Leeks. Peas. Squash. Tomatoes. Peppers. Radishes.  Fruits: Apples. Apricots. Avocado. Berries. Bananas. Cherries. Dates. Figs. Grapes. Lemons. Melon. Oranges. Peaches. Plums. Pomegranate.  Meats and other protein foods: Beans. Almonds. Sunflower seeds. Pine nuts. Peanuts. [place]. Salmon. Scall[place]. Shrimp. [place]. Tilapia. Clams. Oysters. Eggs.  Dairy: Low-fat milk. Cheese. Greek yogurt.  Beverages: Water. Red wine. Herbal tea.  Fats and oils: Extra virgin olive oil. Avocado oil. Grape seed oil.  Sweets and desserts: [place] yogurt with honey. Baked apples. Poached pears. Trail mix.  Seasoning and other foods: Basil. Cilantro. Coriander. Cumin. Mint. Parsley. Sage. Rosemary. Tarragon. Garlic. Oregano. Thyme. Pepper. Balsalmic vinegar. Tahini. Hummus. Tomato sauce. Olives. Mushrooms. ? Limit these  Grains: Prepackaged pasta or rice dishes. Prepackaged cereal with added sugar.  Vegetables: Deep fried potatoes (french fries).  Fruits: Fruit canned in syrup.  Meats and other protein foods: Beef. Pork. Lamb. Poultry with skin. Hot dogs. [place] Salines.  Dairy: Ice cream. Sour cream. Whole milk.  Beverages:  Juice. Sugar-sweetened soft drinks. Beer. Liquor and spirits.  Fats and oils: Butter. Canola oil. Vegetable oil. Beef fat (tallow). Lard.  Sweets and desserts: Cookies. Cakes. Pies. Candy.  Seasoning and other foods: Mayonnaise. Premade sauces and marinades. The items listed may not be a complete list. Talk with your dietitian about what dietary choices are right for you. Summary  The Mediterranean diet includes both food and lifestyle choices.  Eat a variety of fresh fruits and vegetables, beans, nuts, seeds, and whole grains.  Limit the amount of red meat and sweets that you eat.  Talk with your health care provider about whether it is safe for you to drink red wine in moderation. This means 1 glass a day for nonpregnant women and 2 glasses a day for men. A glass of wine equals 5 oz (150 mL). This information is not intended to replace advice given to you by your health care provider. Make sure you discuss any questions you have with your health care provider. Document Revised: 08/31/2015 Document Reviewed: 08/24/2015 Elsevier Patient Education  [place].

## 2019-05-04 ENCOUNTER — Telehealth: Payer: Self-pay

## 2019-05-04 NOTE — Telephone Encounter (Signed)
Spoke with pt who report Crestor 5 mg was prescribed by her pcp and due for lab work in July. Pt will have PCP forward result to Dr. Margaretann Loveless for review.

## 2019-09-27 ENCOUNTER — Other Ambulatory Visit: Payer: Self-pay

## 2019-09-27 ENCOUNTER — Ambulatory Visit (INDEPENDENT_AMBULATORY_CARE_PROVIDER_SITE_OTHER): Payer: 59 | Admitting: Internal Medicine

## 2019-09-27 ENCOUNTER — Encounter: Payer: Self-pay | Admitting: Internal Medicine

## 2019-09-27 VITALS — BP 124/65 | HR 67 | Ht 68.5 in | Wt 140.0 lb

## 2019-09-27 DIAGNOSIS — R001 Bradycardia, unspecified: Secondary | ICD-10-CM | POA: Diagnosis not present

## 2019-09-27 DIAGNOSIS — I493 Ventricular premature depolarization: Secondary | ICD-10-CM | POA: Diagnosis not present

## 2019-09-27 DIAGNOSIS — R002 Palpitations: Secondary | ICD-10-CM | POA: Diagnosis not present

## 2019-09-27 DIAGNOSIS — I480 Paroxysmal atrial fibrillation: Secondary | ICD-10-CM

## 2019-09-27 DIAGNOSIS — E782 Mixed hyperlipidemia: Secondary | ICD-10-CM

## 2019-09-27 NOTE — Progress Notes (Signed)
Cardiology Office Note:    Date:  09/27/2019   ID:  Carolyn Snyder, DOB 08-14-1955, MRN [id number]  PCP:  Fanny Bien, MD  Cardiologist:  Elouise Munroe, MD  Electrophysiologist:  None   Referring MD: Fanny Bien, MD   Chief Complaint/Reason for Referral: PAF, PVCs  History of Present Illness:    Carolyn Snyder is a 64 y.o. female with a history of anxiety, migraine, HLD who is being seen today for the evaluation of palpitations and atrial fibrillation. We attempted to use diltiazem for rate control, however she had symptomatic bradycardia, and this was discontinued at our last visit. She felt much better afterward. She wore the monitor after discontinuing rate control, but continued to have sinus bradycardia. Wore monitor for 2 weeks. Afib, sinus brady, and PVCs. Symptoms seem to correlate best with episodes of PVCs. We discussed that it may be difficult to treat PVCs with underlying bradycardia.   The patient denies chest pain, chest pressure, dyspnea at rest or with exertion, PND, orthopnea, or leg swelling. Denies cough, fever, chills. Denies nausea, vomiting. Denies syncope or presyncope. Denies dizziness or lightheadedness. Denies snoring.  Feels well overall. Monitoring BP per request of PCP, log reviewed today. Infrequent palpitations noted.    Past Medical History:  Diagnosis Date  . Abnormal Pap smear   . Anxiety   . Hyperlipidemia   . Menopause 2011  . Migraine     Past Surgical History:  Procedure Laterality Date  . cryotheraphy  1988  . KNEE SURGERY  1979   removed hemangioma with calcium deposit  . MOHS SURGERY  2012   skin cancer removed nose  . TEMPOROMANDIBULAR JOINT SURGERY  1990    Current Medications: Current Meds  Medication Sig  . rosuvastatin (CRESTOR) 5 MG tablet Take 5 mg by mouth daily.     Allergies:   Penicillins   Social History   Tobacco Use  . Smoking status: Never Smoker  . Smokeless tobacco: Never Used  Substance Use  Topics  . Alcohol use: Yes    Comment: socially  . Drug use: No     Family History: The patient's family history includes Heart disease in her father and mother; Stroke in her maternal grandmother and mother.  ROS:   Please see the history of present illness.    All other systems reviewed and are negative.  EKGs/Labs/Other Studies Reviewed:    The following studies were reviewed today:  EKG:  NSR  Recent Labs: 01/13/2019: ALT 38; BUN 17; Creatinine, Ser 0.48; Hemoglobin 12.3; Magnesium 2.1; Platelets 337; Potassium 4.2; Sodium 142; TSH <0.010  Recent Lipid Panel    Component Value Date/Time   CHOL 235 (H) 06/17/2011 1112   TRIG 155 (H) 06/17/2011 1112   HDL 58 06/17/2011 1112   CHOLHDL 4.1 06/17/2011 1112   VLDL 31 06/17/2011 1112   LDLCALC 146 (H) 06/17/2011 1112    Physical Exam:    VS:  BP 124/65   Pulse 67   Ht 5' 8.5" (1.74 m)   Wt 140 lb (63.5 kg)   SpO2 98%   BMI 20.98 kg/m     Wt Readings from Last 5 Encounters:  09/27/19 140 lb (63.5 kg)  03/29/19 140 lb 6.4 oz (63.7 kg)  02/22/19 139 lb 12.8 oz (63.4 kg)  01/22/19 134 lb (60.8 kg)  07/10/13 134 lb 3.2 oz (60.9 kg)    Constitutional: No acute distress Eyes: sclera non-icteric, normal conjunctiva and lids ENMT: normal  dentition, moist mucous membranes Cardiovascular: regular rhythm, normal rate, no murmurs. S1 and S2 normal. Radial pulses normal bilaterally. No jugular venous distention.  Respiratory: clear to auscultation bilaterally GI : normal bowel sounds, soft and nontender. No distention.   MSK: extremities warm, well perfused. No edema.  NEURO: grossly nonfocal exam, moves all extremities. PSYCH: alert and oriented x 3, normal mood and affect.   ASSESSMENT:    1. Palpitations   2. PVC's (premature ventricular contractions)   3. Paroxysmal atrial fibrillation (HCC)   4. Sinus bradycardia   5. Mixed hyperlipidemia    PLAN:    PAF/palpitations/PVCs/sinus bradycardia - ECG unremarkable  today. She has rare palpitations after alcohol consumption >2 drinks. She limits to 2 now. No known recurrence of AFIB. CHADS2VASC score is 1 for female. No indication for AC at this time. PRN propranolol for sustained palpitations.   HLD - on crestor 5 mg daily, tolerating well.  Blood pressure - reads are within normal limits, we discussed definition of hypertension and what to look out for. Daily BP readings until next f/u.  Total time of encounter: 20 minutes total time of encounter, including 15 minutes spent in face-to-face patient care on the date of this encounter. This time includes coordination of care and counseling regarding above mentioned problem list. Remainder of non-face-to-face time involved reviewing chart documents/testing relevant to the patient encounter and documentation in the medical record. I have independently reviewed documentation from referring provider.   Cherlynn Kaiser, MD [place]  [facility]    Medication Adjustments/Labs and Tests Ordered: Current medicines are reviewed at length with the patient today.  Concerns regarding medicines are outlined above.  Orders Placed This Encounter  Procedures  . EKG 12-Lead   No orders of the defined types were placed in this encounter.   Patient Instructions  Medication Instructions:  No Changes In Medications at this time.  *If you need a refill on your cardiac medications before your next appointment, please call your pharmacy*   Lab Work: None Ordered At This Time.  If you have labs (blood work) drawn today and your tests are completely normal, you will receive your results only by: Marland Kitchen MyChart Message (if you have MyChart) OR . A paper copy in the mail If you have any lab test that is abnormal or we need to change your treatment, we will call you to review the results.   Testing/Procedures: None Ordered At This Time.   Follow-Up: At [facility], you and your health needs are our priority.  As  part of our continuing mission to provide you with exceptional heart care, we have created designated Provider Care Teams.  These Care Teams include your primary Cardiologist (physician) and Advanced Practice Providers (APPs -  Physician Assistants and Nurse Practitioners) who all work together to provide you with the care you need, when you need it.  Your next appointment:   1 year(s)  The format for your next appointment:   In Person  Provider:   Cherlynn Kaiser, MD

## 2019-09-27 NOTE — Patient Instructions (Signed)
Medication Instructions:  No Changes In Medications at this time.  *If you need a refill on your cardiac medications before your next appointment, please call your pharmacy*  Lab Work: None Ordered At This Time.  If you have labs (blood work) drawn today and your tests are completely normal, you will receive your results only by: . MyChart Message (if you have MyChart) OR . A paper copy in the mail If you have any lab test that is abnormal or we need to change your treatment, we will call you to review the results.  Testing/Procedures: None Ordered At This Time.   Follow-Up: At [facility], you and your health needs are our priority.  As part of our continuing mission to provide you with exceptional heart care, we have created designated Provider Care Teams.  These Care Teams include your primary Cardiologist (physician) and Advanced Practice Providers (APPs -  Physician Assistants and Nurse Practitioners) who all work together to provide you with the care you need, when you need it.    Your next appointment:   1 year(s)  The format for your next appointment:   In Person  Provider:   Gayatri Acharya, MD  

## 2020-09-08 ENCOUNTER — Telehealth: Payer: Self-pay | Admitting: Internal Medicine

## 2020-09-08 NOTE — Telephone Encounter (Signed)
Returned call to patient, moved patient's appointment to 9/2 with Dr. Margaretann Loveless. Patient states that she has been checking her BP and HR several times since having the palpitations and feels that this could be driving her HR and BP. Advised patient to check BP once daily in the morning, and only when having palpitations or other symptoms and write those numbers down and bring log to next OV. Advised patient that Anxiety can elevate the HR and BP when consistently checking BP/HR back to back multiple times. Patient is agreeable to this. Patient states at current she feels well and feels that her HR is regular and slowing back down. Advised patient to call back to office with any issues, questions, or concerns. Patient verbalized understanding.

## 2020-09-08 NOTE — Telephone Encounter (Signed)
Patient c/o Palpitations:  High priority if patient c/o lightheadedness, shortness of breath, or chest pain  How long have you had palpitations/irregular HR/ Afib? Are you having the symptoms now?  For a few minutes BP 163/77 HR 105  Are you currently experiencing lightheadedness, SOB or CP?  No  Do you have a history of afib (atrial fibrillation) or irregular heart rhythm? yes  Have you checked your BP or HR? (document readings if available): 121/67   Are you experiencing any other symptoms? No other symptoms  STAT if HR is under 50 or over 120 (normal HR is 60-100 beats per minute)  What is your heart rate?  105  Do you have a log of your heart rate readings (document readings)? No  Do you have any other symptoms? NO Pt have to go to work today at 4:15pm in [place]

## 2020-09-08 NOTE — Telephone Encounter (Signed)
Pt called reporting that roughly around 2:15 pm she felt like her heart was fluttering (BP and HR listed below). Pt denies any symptoms but state  she was just concerned as her HR remained slightly elevated.   2:15 pm- BP 121/67 HR 136 12:40 pm- 163/77 HR 105  3 pm -155/76 HR 100  Currently -118/65 HR 102  Pt state currently she feels like she is back in rhythm. Pt recommended to continue to monitor but will forward message to Dr. Margaretann Loveless to make aware. Pt verbalized understanding.

## 2020-09-08 NOTE — Telephone Encounter (Signed)
Pt report she did not take propranolol as it makes her tired and doesn't feel well afterwards.

## 2020-09-15 ENCOUNTER — Ambulatory Visit (INDEPENDENT_AMBULATORY_CARE_PROVIDER_SITE_OTHER): Payer: 59 | Admitting: Internal Medicine

## 2020-09-15 ENCOUNTER — Other Ambulatory Visit: Payer: Self-pay

## 2020-09-15 ENCOUNTER — Encounter: Payer: Self-pay | Admitting: Internal Medicine

## 2020-09-15 VITALS — BP 128/66 | HR 63 | Ht 68.5 in | Wt 140.0 lb

## 2020-09-15 DIAGNOSIS — I493 Ventricular premature depolarization: Secondary | ICD-10-CM

## 2020-09-15 DIAGNOSIS — R002 Palpitations: Secondary | ICD-10-CM

## 2020-09-15 DIAGNOSIS — R001 Bradycardia, unspecified: Secondary | ICD-10-CM

## 2020-09-15 DIAGNOSIS — E782 Mixed hyperlipidemia: Secondary | ICD-10-CM

## 2020-09-15 DIAGNOSIS — I480 Paroxysmal atrial fibrillation: Secondary | ICD-10-CM

## 2020-09-15 NOTE — Progress Notes (Signed)
Cardiology Office Note:    Date:  09/15/2020   ID:  Carolyn Snyder, DOB November 29, 1955, MRN [id number]  PCP:  Fanny Bien, MD  Cardiologist:  Elouise Munroe, MD  Electrophysiologist:  None   Referring MD: Fanny Bien, MD   Chief Complaint/Reason for Referral: PAF, PVCs  History of Present Illness:    Carolyn Snyder is a 65 y.o. female with a history of anxiety, migraine, HLD who is being seen today for the evaluation of palpitations and atrial fibrillation. We attempted to use diltiazem for rate control, however she had symptomatic bradycardia, and we discontinued. She felt much better afterward. She wore the monitor after discontinuing rate control, but continued to have sinus bradycardia. Wore monitor for 2 weeks. Afib, sinus brady, and PVCs. Symptoms seem to correlate best with episodes of PVCs. We discussed that it may be difficult to treat PVCs with underlying bradycardia. Overall symptoms have improved with diet and lifestyle modification.  Episode of likely afib 8/26, HR 136 bpm 1 hour episode total. No hypotension. We reviewed HR and BP log. No recurrence since, unclear triggers, however she feels it is definitely related to stress. 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 She would like to monitor to see if this life transition is beneficial to symptoms.  The patient denies chest pain, chest pressure, dyspnea at rest or with exertion, PND, orthopnea, or leg swelling. Denies cough, fever, chills. Denies nausea, vomiting. Denies syncope or presyncope. Denies dizziness or lightheadedness.   Past Medical History:  Diagnosis Date   Abnormal Pap smear    Anxiety    Hyperlipidemia    Menopause 2011   Migraine     Past Surgical History:  Procedure Laterality Date   cryotheraphy  [place]   removed hemangioma with calcium deposit   MOHS SURGERY  2012   skin cancer removed nose   TEMPOROMANDIBULAR  JOINT SURGERY  1990    Current Medications: Current Meds  Medication Sig   rosuvastatin (CRESTOR) 10 MG tablet Take 10 mg by mouth daily.     Allergies:   Penicillins   Social History   Tobacco Use   Smoking status: Never   Smokeless tobacco: Never  Substance Use Topics   Alcohol use: Yes    Comment: socially   Drug use: No     Family History: The patient's family history includes Heart disease in her father and mother; Stroke in her maternal grandmother and mother.  ROS:   Please see the history of present illness.    All other systems reviewed and are negative.  EKGs/Labs/Other Studies Reviewed:    The following studies were reviewed today:  EKG:  NSR, nonspecific ST abnormality  Recent Labs: No results found for requested labs within last 8760 hours.  Recent Lipid Panel    Component Value Date/Time   CHOL 235 (H) 06/17/2011 1112   TRIG 155 (H) 06/17/2011 1112   HDL 58 06/17/2011 1112   CHOLHDL 4.1 06/17/2011 1112   VLDL 31 06/17/2011 1112   LDLCALC 146 (H) 06/17/2011 1112    Physical Exam:    VS:  BP 128/66 (BP Location: Left Arm, Patient Position: Sitting, Cuff Size: Normal)   Pulse 63   Ht 5' 8.5" (1.74 m)   Wt 140 lb (63.5 kg)   BMI 20.98 kg/m     Wt Readings from Last 5 Encounters:  09/15/20  140 lb (63.5 kg)  09/27/19 140 lb (63.5 kg)  03/29/19 140 lb 6.4 oz (63.7 kg)  02/22/19 139 lb 12.8 oz (63.4 kg)  01/22/19 134 lb (60.8 kg)    Constitutional: No acute distress Eyes: sclera non-icteric, normal conjunctiva and lids ENMT: normal dentition, moist mucous membranes Cardiovascular: regular rhythm, normal rate, no murmur. S1 and S2 normal. No jugular venous distention.  Respiratory: clear to auscultation bilaterally GI : normal bowel sounds, soft and nontender. No distention.   MSK: extremities warm, well perfused. No edema.  NEURO: grossly nonfocal exam, moves all extremities. PSYCH: alert and oriented x 3, normal mood and affect.    ASSESSMENT:    1. Paroxysmal atrial fibrillation (HCC)   2. Palpitations   3. PVC's (premature ventricular contractions)   4. Sinus bradycardia   5. Mixed hyperlipidemia     PLAN:    PAF/palpitations/PVCs/sinus bradycardia - ECG stable today, NSR. -Likely recurrence of afib last week, but no recurrence. Still with infrequent episodes.  CHADS2VASC score is 1 for female. No indication for AC at this time. Not requiring propranolol. - discussed EP referral. Current symptoms and episodes still infrequent enough to not yet warrant consideration of ablation.  HLD - on crestor 5 mg daily, tolerating well.  Blood pressure - stable on review of BP log.  Total time of encounter: 30 minutes total time of encounter, including 20 minutes spent in face-to-face patient care on the date of this encounter. This time includes coordination of care and counseling regarding above mentioned problem list. Remainder of non-face-to-face time involved reviewing chart documents/testing relevant to the patient encounter and documentation in the medical record. I have independently reviewed documentation from referring provider.   Cherlynn Kaiser, MD, [facility]     Medication Adjustments/Labs and Tests Ordered: Current medicines are reviewed at length with the patient today.  Concerns regarding medicines are outlined above.  Orders Placed This Encounter  Procedures   EKG 12-Lead    No orders of the defined types were placed in this encounter.   Patient Instructions  Medication Instructions:  No Changes In Medications at this time.  *If you need a refill on your cardiac medications before your next appointment, please call your pharmacy*  Follow-Up: At [facility], you and your health needs are our priority.  As part of our continuing mission to provide you with exceptional heart care, we have created designated Provider Care Teams.  These Care Teams include your primary  Cardiologist (physician) and Advanced Practice Providers (APPs -  Physician Assistants and Nurse Practitioners) who all work together to provide you with the care you need, when you need it.  Your next appointment:   MARCH 31st at 8:20am  The format for your next appointment:   In Person  Provider:   Cherlynn Kaiser, MD

## 2020-09-15 NOTE — Patient Instructions (Signed)
Medication Instructions:  No Changes In Medications at this time.  *If you need a refill on your cardiac medications before your next appointment, please call your pharmacy*  Follow-Up: At [facility], you and your health needs are our priority.  As part of our continuing mission to provide you with exceptional heart care, we have created designated Provider Care Teams.  These Care Teams include your primary Cardiologist (physician) and Advanced Practice Providers (APPs -  Physician Assistants and Nurse Practitioners) who all work together to provide you with the care you need, when you need it.  Your next appointment:   MARCH 31st at 8:20am  The format for your next appointment:   In Person  Provider:   Cherlynn Kaiser, MD

## 2020-10-05 ENCOUNTER — Ambulatory Visit: Payer: 59 | Admitting: Internal Medicine

## 2021-01-22 DIAGNOSIS — L821 Other seborrheic keratosis: Secondary | ICD-10-CM | POA: Diagnosis not present

## 2021-01-22 DIAGNOSIS — Z85828 Personal history of other malignant neoplasm of skin: Secondary | ICD-10-CM | POA: Diagnosis not present

## 2021-01-22 DIAGNOSIS — D224 Melanocytic nevi of scalp and neck: Secondary | ICD-10-CM | POA: Diagnosis not present

## 2021-01-22 DIAGNOSIS — D2261 Melanocytic nevi of right upper limb, including shoulder: Secondary | ICD-10-CM | POA: Diagnosis not present

## 2021-02-01 DIAGNOSIS — M419 Scoliosis, unspecified: Secondary | ICD-10-CM | POA: Diagnosis not present

## 2021-02-01 DIAGNOSIS — G8929 Other chronic pain: Secondary | ICD-10-CM | POA: Diagnosis not present

## 2021-02-01 DIAGNOSIS — M549 Dorsalgia, unspecified: Secondary | ICD-10-CM | POA: Diagnosis not present

## 2021-02-13 DIAGNOSIS — M545 Low back pain, unspecified: Secondary | ICD-10-CM | POA: Diagnosis not present

## 2021-02-20 DIAGNOSIS — M545 Low back pain, unspecified: Secondary | ICD-10-CM | POA: Diagnosis not present

## 2021-02-27 DIAGNOSIS — M545 Low back pain, unspecified: Secondary | ICD-10-CM | POA: Diagnosis not present

## 2021-03-06 DIAGNOSIS — M545 Low back pain, unspecified: Secondary | ICD-10-CM | POA: Diagnosis not present

## 2021-03-13 DIAGNOSIS — M545 Low back pain, unspecified: Secondary | ICD-10-CM | POA: Diagnosis not present

## 2021-03-14 DIAGNOSIS — E559 Vitamin D deficiency, unspecified: Secondary | ICD-10-CM | POA: Diagnosis not present

## 2021-03-14 DIAGNOSIS — I10 Essential (primary) hypertension: Secondary | ICD-10-CM | POA: Diagnosis not present

## 2021-03-20 DIAGNOSIS — I4891 Unspecified atrial fibrillation: Secondary | ICD-10-CM | POA: Diagnosis not present

## 2021-03-20 DIAGNOSIS — M549 Dorsalgia, unspecified: Secondary | ICD-10-CM | POA: Diagnosis not present

## 2021-03-20 DIAGNOSIS — E559 Vitamin D deficiency, unspecified: Secondary | ICD-10-CM | POA: Diagnosis not present

## 2021-03-20 DIAGNOSIS — M545 Low back pain, unspecified: Secondary | ICD-10-CM | POA: Diagnosis not present

## 2021-03-20 DIAGNOSIS — E782 Mixed hyperlipidemia: Secondary | ICD-10-CM | POA: Diagnosis not present

## 2021-04-12 DIAGNOSIS — Z6820 Body mass index (BMI) 20.0-20.9, adult: Secondary | ICD-10-CM | POA: Diagnosis not present

## 2021-04-12 DIAGNOSIS — Z6821 Body mass index (BMI) 21.0-21.9, adult: Secondary | ICD-10-CM | POA: Diagnosis not present

## 2021-04-12 DIAGNOSIS — Z01419 Encounter for gynecological examination (general) (routine) without abnormal findings: Secondary | ICD-10-CM | POA: Diagnosis not present

## 2021-04-12 DIAGNOSIS — Z1231 Encounter for screening mammogram for malignant neoplasm of breast: Secondary | ICD-10-CM | POA: Diagnosis not present

## 2021-04-12 DIAGNOSIS — Z124 Encounter for screening for malignant neoplasm of cervix: Secondary | ICD-10-CM | POA: Diagnosis not present

## 2021-04-12 NOTE — Progress Notes (Signed)
?Cardiology Office Note:   ? ?Date:  04/13/2021  ? ?ID:  Carolyn Snyder, DOB 1955/01/22, MRN [id number] ? ?PCP:  Fanny Bien, MD  ?Cardiologist:  Elouise Munroe, MD  ?Electrophysiologist:  None  ? ?Referring MD: Fanny Bien, MD  ? ?Chief Complaint/Reason for Referral: ?PAF, PVCs ? ?History of Present Illness:   ? ?Carolyn Snyder is a 66 y.o. female with a history of anxiety, migraine, HLD who is being seen today for the evaluation of palpitations and atrial fibrillation. We attempted to use diltiazem for rate control, however she had symptomatic bradycardia, and we discontinued. She felt much better afterward. She wore the monitor after discontinuing rate control, but continued to have sinus bradycardia. Wore monitor for 2 weeks. Afib, sinus brady, and PVCs. Symptoms seem to correlate best with episodes of PVCs. We discussed that it may be difficult to treat PVCs with underlying bradycardia. Overall symptoms have improved with diet and lifestyle modification. ? ?At her last appointment we reviewed her HR and BP log. There was one episode of likely Afib 8/26, with HR 136 bpm for 1 hour total. No hypotension. Since that episode she had no recurrent arrhythmias. 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 She was going to monitor for any improvement in her symptoms as she believes stress may be a trigger for her arrhythmia. ? ?Today, she is feeling okay overall. 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 Since her last visit she has had a few minor headaches, but nothing that was worrisome to her.  She attributes this to eliminating caffeine from her diet. ? ?A few times she experienced a "wave" of lightheadedness for about 2 seconds. This is difficult for her to describe. She believes this may be related to not being well-hydrated. ? ?For activity she tries to keep moving, and sometimes lifts small weights. Also she is taking yoga classes at the [facility] twice a week. She has completed  PT for her scoliosis. However, her back pain is not thought to be due to her scoliosis because the pain is not localized. ? ?Lately she has been successful with dietary changes. She has limited her alcohol consumption to 2 drinks per event. She has eliminated all caffeine and focuses on drinking water or caffeine-free coke. Additionally, she has found that she does not feel well after too much sugar in her diet. Instead of sugary snacks she has switched to protein based snacks. ? ?Rosuvastatin was increased to 10 mg. After this was increased, her most recent labs are as below (per her patient portal): ? ?Total cholesterol: 153 ?Triglycerides: 133 ?HDL:  58 ?LDL:  72 ? ?She denies any palpitations, chest pain, shortness of breath, or peripheral edema. No syncope, orthopnea, or PND. ? ? ?Past Medical History:  ?Diagnosis Date  ? Abnormal Pap smear   ? Anxiety   ? Hyperlipidemia   ? Menopause 2011  ? Migraine   ? ? ?Past Surgical History:  ?Procedure Laterality Date  ? cryotheraphy  1988  ? KNEE SURGERY  1979  ? removed hemangioma with calcium deposit  ? MOHS SURGERY  2012  ? skin cancer removed nose  ? [place]  ? ? ?Current Medications: ?Current Meds  ?Medication Sig  ? rosuvastatin (CRESTOR) 10 MG tablet Take 10 mg by mouth daily.  ?  ? ?Allergies:   Penicillins  ? ?Social History  ? ?Tobacco Use  ? Smoking  status: Never  ? Smokeless tobacco: Never  ?Substance Use Topics  ? Alcohol use: Yes  ?  Comment: socially  ? Drug use: No  ?  ? ?Family History: ?The patient's family history includes Heart disease in her father and mother; Stroke in her maternal grandmother and mother. ? ?ROS:   ?Please see the history of present illness.    ?(+) Minor headaches ?(+) Lightheadedness ?All other systems reviewed and are negative. ? ?EKGs/Labs/Other Studies Reviewed:   ? ?The following studies were reviewed today: ? ?Monitor 02/2019: ?Indication: afib and palpitations ?  ?Minimum HR (bpm): 43 ?Maximum  HR (bpm): 153 ?  ?Supraventricular Ectopy: <1% ?  ?Ventricular Ectopy: 2% ?NSVT: none ?Ventricular Tachycardia: none ?  ?Pauses: none ?AV block: none  ?  ?Atrial fibrillation: 5 hrs, 46 mins of atrial fibrillation, 40% of which was with rapid ventricular response.  ?Diary events: fluttering and skipped beats, lightheadedness, associated with PVCs ?  ?IMPRESSION: Atrial fibrillation detected. Symptoms of fluttering, skipped beats and lightheadedness temporally correlates with episodes of frequent PVCs. ? ?Echo 01/21/2019: ? 1. Left ventricular ejection fraction, by visual estimation, is 60 to  ?65%. The left ventricle has normal function. There is no left ventricular  ?hypertrophy.  ? 2. Global right ventricle has normal systolic function.The right  ?ventricular size is normal.  ? 3. Left atrial size was normal.  ? 4. Right atrial size was normal.  ? 5. Trivial pericardial effusion is present.  ? 6. The mitral valve is normal in structure. No evidence of mitral valve  ?regurgitation.  ? 7. The tricuspid valve is normal in structure.  ? 8. The aortic valve is tricuspid. Aortic valve regurgitation is not  ?visualized. No evidence of aortic valve sclerosis or stenosis.  ? 9. The pulmonic valve was not well visualized. Pulmonic valve  ?regurgitation is not visualized.  ?10. TR signal is inadequate for assessing pulmonary artery systolic  ?pressure.  ?11. The inferior vena cava is normal in size with greater than 50%  ?respiratory variability, suggesting right atrial pressure of 3 mmHg.  ? ?EKG:  EKG is personally reviewed. ?04/13/2021: Sinus rhythm. Rate 63 bpm. ?09/15/2020: NSR, nonspecific ST abnormality ? ?Recent Labs: ?No results found for requested labs within last 8760 hours.  ? ?Recent Lipid Panel ?   ?Component Value Date/Time  ? CHOL 235 (H) 06/17/2011 1112  ? TRIG 155 (H) 06/17/2011 1112  ? HDL 58 06/17/2011 1112  ? CHOLHDL 4.1 06/17/2011 1112  ? VLDL 31 06/17/2011 1112  ? LDLCALC 146 (H) 06/17/2011 1112   ? ? ?Physical Exam:   ? ?VS:  BP (!) 142/76   Pulse 63   Ht '5\' 8"'$  (1.727 m)   Wt 136 lb 9.6 oz (62 kg)   SpO2 98%   BMI 20.77 kg/m?    ? ?Wt Readings from Last 5 Encounters:  ?04/13/21 136 lb 9.6 oz (62 kg)  ?09/15/20 140 lb (63.5 kg)  ?09/27/19 140 lb (63.5 kg)  ?03/29/19 140 lb 6.4 oz (63.7 kg)  ?02/22/19 139 lb 12.8 oz (63.4 kg)  ?  ?Constitutional: No acute distress ?Eyes: sclera non-icteric, normal conjunctiva and lids ?ENMT: normal dentition, moist mucous membranes ?Cardiovascular: regular rhythm, normal rate, no murmur. S1 and S2 normal. No jugular venous distention.  ?Respiratory: clear to auscultation bilaterally ?GI : normal bowel sounds, soft and nontender. No distention.   ?MSK: extremities warm, well perfused. No edema.  ?NEURO: grossly nonfocal exam, moves all extremities. ?PSYCH: alert and oriented x 3,  normal mood and affect.  ? ?ASSESSMENT:   ? ?1. Paroxysmal atrial fibrillation (HCC)   ?2. Palpitations   ?3. PVC's (premature ventricular contractions)   ?4. Sinus bradycardia   ?5. Mixed hyperlipidemia   ? ? ?PLAN:   ? ?PAF/palpitations/PVCs/sinus bradycardia - ECG stable today, NSR. ?-No recurrence of atrial fibrillation or significant palpitations since last visit. ?-Not currently on AV nodal blocking agents given prior sinus bradycardia, she does not need it at this time. ? ?HLD -recent uptitration to Crestor 10 mg daily, tolerating well. ? ?Blood pressure -mildly elevated today but overall stable.  She feels well. ? ?Follow-up:  6 months. ? ?Total time of encounter: ?22 minutes total time of encounter, including 15 minutes spent in face-to-face patient care on the date of this encounter. This time includes coordination of care and counseling regarding above mentioned problem list. Remainder of non-face-to-face time involved reviewing chart documents/testing relevant to the patient encounter and documentation in the medical record. I have independently reviewed documentation from referring  provider.  ? ?Cherlynn Kaiser, MD, [facility] ?[place]  ? ?Medication Adjustments/Labs and Tests Ordered: ?Current medicines are reviewed at length with the patient today.  Concerns regarding medicines are

## 2021-04-13 ENCOUNTER — Encounter: Payer: Self-pay | Admitting: Internal Medicine

## 2021-04-13 ENCOUNTER — Ambulatory Visit (INDEPENDENT_AMBULATORY_CARE_PROVIDER_SITE_OTHER): Payer: Medicare Other | Admitting: Internal Medicine

## 2021-04-13 VITALS — BP 142/76 | HR 63 | Ht 68.0 in | Wt 136.6 lb

## 2021-04-13 DIAGNOSIS — I480 Paroxysmal atrial fibrillation: Secondary | ICD-10-CM

## 2021-04-13 DIAGNOSIS — R001 Bradycardia, unspecified: Secondary | ICD-10-CM

## 2021-04-13 DIAGNOSIS — I493 Ventricular premature depolarization: Secondary | ICD-10-CM | POA: Diagnosis not present

## 2021-04-13 DIAGNOSIS — E782 Mixed hyperlipidemia: Secondary | ICD-10-CM

## 2021-04-13 DIAGNOSIS — R002 Palpitations: Secondary | ICD-10-CM

## 2021-04-13 NOTE — Patient Instructions (Signed)
Medication Instructions:  ?No Changes In Medications at this time.  ?*If you need a refill on your cardiac medications before your next appointment, please call your pharmacy* ? ?Follow-Up: ?At [facility], you and your health needs are our priority.  As part of our continuing mission to provide you with exceptional heart care, we have created designated Provider Care Teams.  These Care Teams include your primary Cardiologist (physician) and Advanced Practice Providers (APPs -  Physician Assistants and Nurse Practitioners) who all work together to provide you with the care you need, when you need it. ? ?Your next appointment:   ?6 month(s) ? ?The format for your next appointment:   ?In Person ? ?Provider:   ?Gayatri A Acharya, MD   ? ?  ?

## 2021-04-16 ENCOUNTER — Other Ambulatory Visit: Payer: Self-pay | Admitting: Obstetrics and Gynecology

## 2021-04-16 DIAGNOSIS — R928 Other abnormal and inconclusive findings on diagnostic imaging of breast: Secondary | ICD-10-CM

## 2021-05-01 ENCOUNTER — Ambulatory Visit
Admission: RE | Admit: 2021-05-01 | Discharge: 2021-05-01 | Disposition: A | Discharge status: Home / Self Care | Payer: Medicare Other | Source: Ambulatory Visit | Attending: Obstetrics and Gynecology | Admitting: Obstetrics and Gynecology

## 2021-05-01 DIAGNOSIS — R928 Other abnormal and inconclusive findings on diagnostic imaging of breast: Secondary | ICD-10-CM

## 2021-05-01 DIAGNOSIS — N6002 Solitary cyst of left breast: Secondary | ICD-10-CM | POA: Diagnosis not present

## 2021-06-27 DIAGNOSIS — M25561 Pain in right knee: Secondary | ICD-10-CM | POA: Diagnosis not present

## 2021-07-24 DIAGNOSIS — D224 Melanocytic nevi of scalp and neck: Secondary | ICD-10-CM | POA: Diagnosis not present

## 2021-07-24 DIAGNOSIS — Z85828 Personal history of other malignant neoplasm of skin: Secondary | ICD-10-CM | POA: Diagnosis not present

## 2021-07-24 DIAGNOSIS — D225 Melanocytic nevi of trunk: Secondary | ICD-10-CM | POA: Diagnosis not present

## 2021-07-24 DIAGNOSIS — D220 Melanocytic nevi of lip: Secondary | ICD-10-CM | POA: Diagnosis not present

## 2021-08-22 DIAGNOSIS — R634 Abnormal weight loss: Secondary | ICD-10-CM | POA: Diagnosis not present

## 2021-08-22 DIAGNOSIS — K056 Periodontal disease, unspecified: Secondary | ICD-10-CM | POA: Diagnosis not present

## 2021-08-22 DIAGNOSIS — M549 Dorsalgia, unspecified: Secondary | ICD-10-CM | POA: Diagnosis not present

## 2021-08-22 DIAGNOSIS — E785 Hyperlipidemia, unspecified: Secondary | ICD-10-CM | POA: Diagnosis not present

## 2021-09-24 ENCOUNTER — Encounter: Payer: Self-pay | Admitting: Internal Medicine

## 2021-09-24 ENCOUNTER — Ambulatory Visit: Payer: Medicare Other | Attending: Internal Medicine | Admitting: Internal Medicine

## 2021-09-24 VITALS — BP 140/72 | HR 63 | Ht 68.0 in | Wt 134.0 lb

## 2021-09-24 DIAGNOSIS — R002 Palpitations: Secondary | ICD-10-CM | POA: Diagnosis not present

## 2021-09-24 DIAGNOSIS — E782 Mixed hyperlipidemia: Secondary | ICD-10-CM

## 2021-09-24 DIAGNOSIS — R001 Bradycardia, unspecified: Secondary | ICD-10-CM

## 2021-09-24 DIAGNOSIS — I493 Ventricular premature depolarization: Secondary | ICD-10-CM

## 2021-09-24 DIAGNOSIS — I480 Paroxysmal atrial fibrillation: Secondary | ICD-10-CM

## 2021-09-24 NOTE — Patient Instructions (Signed)
Medication Instructions:  No Changes In Medications at this time.  *If you need a refill on your cardiac medications before your next appointment, please call your pharmacy*  Follow-Up: At [facility], you and your health needs are our priority.  As part of our continuing mission to provide you with exceptional heart care, we have created designated Provider Care Teams.  These Care Teams include your primary Cardiologist (physician) and Advanced Practice Providers (APPs -  Physician Assistants and Nurse Practitioners) who all work together to provide you with the care you need, when you need it.  Your next appointment:   6 month(s)  The format for your next appointment:   In Person  Provider:   Gayatri A Acharya, MD          

## 2021-09-24 NOTE — Progress Notes (Signed)
Cardiology Office Note:    Date:  09/24/2021   ID:  Carolyn Snyder, DOB 06-05-1955, MRN [id number]  PCP:  Fanny Bien, MD  Cardiologist:  Elouise Munroe, MD  Electrophysiologist:  None   Referring MD: Fanny Bien, MD   Chief Complaint/Reason for Referral: PAF, PVCs  History of Present Illness:    Carolyn Snyder is a 66 y.o. female with a history of anxiety, migraine, HLD who is being seen today for the evaluation of palpitations and atrial fibrillation. We attempted to use diltiazem for rate control, however she had symptomatic bradycardia, and we discontinued. She felt much better afterward. She wore the monitor after discontinuing rate control, but continued to have sinus bradycardia. Wore monitor for 2 weeks. Afib, sinus brady, and PVCs. Symptoms seem to correlate best with episodes of PVCs. We discussed that it may be difficult to treat PVCs with underlying bradycardia. Overall symptoms have improved with diet and lifestyle modification.  Doing well overall. Job change has been a positive change in her life, reducing her stress. 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 Has decreased sugar/alcohol/caffeine with positive impact.   The patient denies chest pain, chest pressure, dyspnea at rest or with exertion, palpitations, PND, orthopnea, or leg swelling. Denies cough, fever, chills. Denies nausea, vomiting. Denies syncope or presyncope. Denies dizziness or lightheadedness. Denies snoring.    Past Medical History:  Diagnosis Date   Abnormal Pap smear    Anxiety    Hyperlipidemia    Menopause 2011   Migraine     Past Surgical History:  Procedure Laterality Date   cryotheraphy  [place]   removed hemangioma with calcium deposit   MOHS SURGERY  2012   skin cancer removed nose   TEMPOROMANDIBULAR JOINT SURGERY  1990    Current Medications: Current Meds  Medication Sig   rosuvastatin (CRESTOR) 10 MG tablet Take 10 mg  by mouth daily.     Allergies:   Penicillins   Social History   Tobacco Use   Smoking status: Never   Smokeless tobacco: Never  Substance Use Topics   Alcohol use: Yes    Comment: socially   Drug use: No     Family History: The patient's family history includes Breast cancer (age of onset: 53) in her cousin; Heart disease in her father and mother; Stroke in her maternal grandmother and mother.  ROS:   Please see the history of present illness.    All other systems reviewed and are negative.  EKGs/Labs/Other Studies Reviewed:    The following studies were reviewed today:  Monitor 02/2019: Indication: afib and palpitations   Minimum HR (bpm): 43 Maximum HR (bpm): 153   Supraventricular Ectopy: <1%   Ventricular Ectopy: 2% NSVT: none Ventricular Tachycardia: none   Pauses: none AV block: none    Atrial fibrillation: 5 hrs, 46 mins of atrial fibrillation, 40% of which was with rapid ventricular response.  Diary events: fluttering and skipped beats, lightheadedness, associated with PVCs   IMPRESSION: Atrial fibrillation detected. Symptoms of fluttering, skipped beats and lightheadedness temporally correlates with episodes of frequent PVCs.  Echo 01/21/2019:  1. Left ventricular ejection fraction, by visual estimation, is 60 to  65%. The left ventricle has normal function. There is no left ventricular  hypertrophy.   2. Global right ventricle has normal systolic function.The right  ventricular size is normal.   3. Left atrial size was normal.  4. Right atrial size was normal.   5. Trivial pericardial effusion is present.   6. The mitral valve is normal in structure. No evidence of mitral valve  regurgitation.   7. The tricuspid valve is normal in structure.   8. The aortic valve is tricuspid. Aortic valve regurgitation is not  visualized. No evidence of aortic valve sclerosis or stenosis.   9. The pulmonic valve was not well visualized. Pulmonic valve   regurgitation is not visualized.  10. TR signal is inadequate for assessing pulmonary artery systolic  pressure.  11. The inferior vena cava is normal in size with greater than 50%  respiratory variability, suggesting right atrial pressure of 3 mmHg.   EKG:  EKG is personally reviewed. 09/24/21: NSR 04/13/2021: Sinus rhythm. Rate 63 bpm. 09/15/2020: NSR, nonspecific ST abnormality  Recent Labs: No results found for requested labs within last 365 days.   Recent Lipid Panel    Component Value Date/Time   CHOL 235 (H) 06/17/2011 1112   TRIG 155 (H) 06/17/2011 1112   HDL 58 06/17/2011 1112   CHOLHDL 4.1 06/17/2011 1112   VLDL 31 06/17/2011 1112   LDLCALC 146 (H) 06/17/2011 1112    Physical Exam:    VS:  BP (!) 140/72   Pulse 63   Ht '5\' 8"'$  (1.727 m)   Wt 134 lb (60.8 kg)   SpO2 99%   BMI 20.37 kg/m     Wt Readings from Last 5 Encounters:  09/24/21 134 lb (60.8 kg)  04/13/21 136 lb 9.6 oz (62 kg)  09/15/20 140 lb (63.5 kg)  09/27/19 140 lb (63.5 kg)  03/29/19 140 lb 6.4 oz (63.7 kg)    Constitutional: No acute distress Eyes: sclera non-icteric, normal conjunctiva and lids ENMT: normal dentition, moist mucous membranes Cardiovascular: regular rhythm, normal rate, no murmur. S1 and S2 normal. No jugular venous distention.  Respiratory: clear to auscultation bilaterally GI : normal bowel sounds, soft and nontender. No distention.   MSK: extremities warm, well perfused. No edema.  NEURO: grossly nonfocal exam, moves all extremities. PSYCH: alert and oriented x 3, normal mood and affect.   ASSESSMENT:    1. Paroxysmal atrial fibrillation (HCC)   2. Palpitations   3. PVC's (premature ventricular contractions)   4. Sinus bradycardia   5. Mixed hyperlipidemia      PLAN:    PAF/palpitations/PVCs/sinus bradycardia - ECG stable today, NSR. -No recurrence of atrial fibrillation or significant palpitations.  -Not currently on AV nodal blocking agents given prior sinus  bradycardia, she does not need it at this time. - CHADS2VASC is now 2 for age and female. However, no recurrent afib, will observe.   HLD -Crestor 10 mg daily, tolerating well. LDL 77, HDL 64, Trig 104.   Blood pressure -mildly elevated today but overall stable.  She feels well.  Follow-up:  6 months.  Total time of encounter: 20 minutes total time of encounter, including 15 minutes spent in face-to-face patient care on the date of this encounter. This time includes coordination of care and counseling regarding above mentioned problem list. Remainder of non-face-to-face time involved reviewing chart documents/testing relevant to the patient encounter and documentation in the medical record. I have independently reviewed documentation from referring provider.   Cherlynn Kaiser, MD, [facility]    Medication Adjustments/Labs and Tests Ordered: Current medicines are reviewed at length with the patient today.  Concerns regarding medicines are outlined above.   Orders Placed This Encounter  Procedures  EKG 12-Lead   No orders of the defined types were placed in this encounter.  Patient Instructions  Medication Instructions:  No Changes In Medications at this time.  *If you need a refill on your cardiac medications before your next appointment, please call your pharmacy*  Follow-Up: At [facility], you and your health needs are our priority.  As part of our continuing mission to provide you with exceptional heart care, we have created designated Provider Care Teams.  These Care Teams include your primary Cardiologist (physician) and Advanced Practice Providers (APPs -  Physician Assistants and Nurse Practitioners) who all work together to provide you with the care you need, when you need it.  Your next appointment:   6 month(s)  The format for your next appointment:   In Person  Provider:   Elouise Munroe, MD

## 2021-10-01 DIAGNOSIS — Z1331 Encounter for screening for depression: Secondary | ICD-10-CM | POA: Diagnosis not present

## 2021-10-01 DIAGNOSIS — Z1339 Encounter for screening examination for other mental health and behavioral disorders: Secondary | ICD-10-CM | POA: Diagnosis not present

## 2021-10-01 DIAGNOSIS — Z0000 Encounter for general adult medical examination without abnormal findings: Secondary | ICD-10-CM | POA: Diagnosis not present

## 2021-10-01 DIAGNOSIS — Z23 Encounter for immunization: Secondary | ICD-10-CM | POA: Diagnosis not present

## 2021-10-09 DIAGNOSIS — K5730 Diverticulosis of large intestine without perforation or abscess without bleeding: Secondary | ICD-10-CM | POA: Diagnosis not present

## 2021-10-09 DIAGNOSIS — E782 Mixed hyperlipidemia: Secondary | ICD-10-CM | POA: Diagnosis not present

## 2021-10-09 DIAGNOSIS — Z1211 Encounter for screening for malignant neoplasm of colon: Secondary | ICD-10-CM | POA: Diagnosis not present

## 2021-10-09 DIAGNOSIS — F419 Anxiety disorder, unspecified: Secondary | ICD-10-CM | POA: Diagnosis not present

## 2021-11-19 DIAGNOSIS — D125 Benign neoplasm of sigmoid colon: Secondary | ICD-10-CM | POA: Diagnosis not present

## 2021-11-19 DIAGNOSIS — K635 Polyp of colon: Secondary | ICD-10-CM | POA: Diagnosis not present

## 2021-11-19 DIAGNOSIS — K5730 Diverticulosis of large intestine without perforation or abscess without bleeding: Secondary | ICD-10-CM | POA: Diagnosis not present

## 2021-11-19 DIAGNOSIS — Z1211 Encounter for screening for malignant neoplasm of colon: Secondary | ICD-10-CM | POA: Diagnosis not present

## 2022-01-24 DIAGNOSIS — L821 Other seborrheic keratosis: Secondary | ICD-10-CM | POA: Diagnosis not present

## 2022-01-24 DIAGNOSIS — Z85828 Personal history of other malignant neoplasm of skin: Secondary | ICD-10-CM | POA: Diagnosis not present

## 2022-01-24 DIAGNOSIS — D2261 Melanocytic nevi of right upper limb, including shoulder: Secondary | ICD-10-CM | POA: Diagnosis not present

## 2022-01-24 DIAGNOSIS — L812 Freckles: Secondary | ICD-10-CM | POA: Diagnosis not present

## 2022-01-24 DIAGNOSIS — L570 Actinic keratosis: Secondary | ICD-10-CM | POA: Diagnosis not present

## 2022-03-19 DIAGNOSIS — L570 Actinic keratosis: Secondary | ICD-10-CM | POA: Diagnosis not present

## 2022-03-26 NOTE — Progress Notes (Signed)
Cardiology Office Note:    Date:  03/27/2022  ID:  Carolyn Snyder, DOB 1955-10-26, MRN [id number]  PCP:  Fanny Bien, MD  Cardiologist:  Elouise Munroe, MD  Electrophysiologist:  None   Referring MD: Fanny Bien, MD   Chief Complaint/Reason for Referral: PAF, PVCs  History of Present Illness:    Carolyn Snyder is a 67 y.o. female with a history of anxiety, migraine, HLD who is being seen today for follow-up of palpitations and atrial fibrillation. We attempted to use diltiazem for rate control, however she had symptomatic bradycardia, and we discontinued. She felt much better afterward. She wore the monitor after discontinuing rate control, but continued to have sinus bradycardia. Wore monitor for 2 weeks. Afib, sinus brady, and PVCs. Symptoms seem to correlate best with episodes of PVCs. We discussed that it may be difficult to treat PVCs with underlying bradycardia. Overall symptoms had improved with diet and lifestyle modification.  At her last visit she was doing well and denied recurrence of atrial fibrillation or significant palpitations. She noted reduced stress after a recent job change. 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 She had successfully decreased sugar/alcohol/caffeine with positive impact. Blood pressures were stable overall.  Today, she is generally feeling well. 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 Her anxiety has significantly improved.   Usually she checks her blood pressure in the afternoon, but is also trying not to "obsess" about her readings. She presents a BP log which is personally reviewed and shows mostly [id number] systolic with occasional 0000000. Her pulse appears stable. Lately she denies any significant arrhythmic episodes. There was a time that she may have had 1 minor episode of Afib. However, she notes that it was not nearly severe as prior episodes; her heart was only beating a little faster than  usual. We discussed home monitoring devices such as the [facility].  She has confirmed that too much sugar is a trigger for worsening palpitations and just feeling her heart beat faster. Previously this was more of an issue when eating late at night as she works on Thursday and Friday nights. Instead of sugary foods she has switched to avocado toast.  During the day she limits herself to a 2 drink maximum. She avoids any alcohol at night. Of note, she admits to having room for improvement with drinking more water.  For a year now she has been participating in yoga at the [facility]. It also helps to calm her mind. Additionally she is trying to add some weight training to her regimen. When walking with her own dog she is able to keep a good pace without symptoms.  She denies any chest pain, shortness of breath, or peripheral edema. No lightheadedness, headaches, syncope, orthopnea, or PND.   Past Medical History:  Diagnosis Date   Abnormal Pap smear    Anxiety    Hyperlipidemia    Menopause 2011   Migraine     Past Surgical History:  Procedure Laterality Date   cryotheraphy  [place]   removed hemangioma with calcium deposit   MOHS SURGERY  2012   skin cancer removed nose   TEMPOROMANDIBULAR JOINT SURGERY  1990    Current Medications: Current Meds  Medication Sig   rosuvastatin (CRESTOR) 10 MG tablet Take 10 mg by mouth daily.     Allergies:   Penicillins   Social History   Tobacco  Use   Smoking status: Never   Smokeless tobacco: Never  Substance Use Topics   Alcohol use: Yes    Comment: socially   Drug use: No     Family History: The patient's family history includes Breast cancer (age of onset: 80) in her cousin; Heart disease in her father and mother; Stroke in her maternal grandmother and mother.  ROS:   Please see the history of present illness.    All other systems reviewed and are negative.  EKGs/Labs/Other Studies Reviewed:    The  following studies were reviewed today:  Monitor 02/2019: Indication: afib and palpitations   Minimum HR (bpm): 43 Maximum HR (bpm): 153   Supraventricular Ectopy: <1%   Ventricular Ectopy: 2% NSVT: none Ventricular Tachycardia: none   Pauses: none AV block: none    Atrial fibrillation: 5 hrs, 46 mins of atrial fibrillation, 40% of which was with rapid ventricular response.  Diary events: fluttering and skipped beats, lightheadedness, associated with PVCs   IMPRESSION: Atrial fibrillation detected. Symptoms of fluttering, skipped beats and lightheadedness temporally correlates with episodes of frequent PVCs.  Echo 01/21/2019:  1. Left ventricular ejection fraction, by visual estimation, is 60 to  65%. The left ventricle has normal function. There is no left ventricular  hypertrophy.   2. Global right ventricle has normal systolic function.The right  ventricular size is normal.   3. Left atrial size was normal.   4. Right atrial size was normal.   5. Trivial pericardial effusion is present.   6. The mitral valve is normal in structure. No evidence of mitral valve  regurgitation.   7. The tricuspid valve is normal in structure.   8. The aortic valve is tricuspid. Aortic valve regurgitation is not  visualized. No evidence of aortic valve sclerosis or stenosis.   9. The pulmonic valve was not well visualized. Pulmonic valve  regurgitation is not visualized.  10. TR signal is inadequate for assessing pulmonary artery systolic  pressure.  11. The inferior vena cava is normal in size with greater than 50%  respiratory variability, suggesting right atrial pressure of 3 mmHg.   EKG:  EKG is personally reviewed. 03/27/2022:  Sinus rhythm. Normal rate.  09/24/21: NSR 04/13/2021: Sinus rhythm. Rate 63 bpm. 09/15/2020: NSR, nonspecific ST abnormality  Recent Labs: No results found for requested labs within last 365 days.   Recent Lipid Panel    Component Value Date/Time   CHOL 235  (H) 06/17/2011 1112   TRIG 155 (H) 06/17/2011 1112   HDL 58 06/17/2011 1112   CHOLHDL 4.1 06/17/2011 1112   VLDL 31 06/17/2011 1112   LDLCALC 146 (H) 06/17/2011 1112    Physical Exam:    VS:  BP 132/72   Pulse 61   Ht 5\' 8"  (1.727 m)   Wt 132 lb (59.9 kg)   SpO2 97%   BMI 20.07 kg/m     Wt Readings from Last 5 Encounters:  03/27/22 132 lb (59.9 kg)  09/24/21 134 lb (60.8 kg)  04/13/21 136 lb 9.6 oz (62 kg)  09/15/20 140 lb (63.5 kg)  09/27/19 140 lb (63.5 kg)    Constitutional: No acute distress Eyes: sclera non-icteric, normal conjunctiva and lids ENMT: normal dentition, moist mucous membranes Cardiovascular: regular rhythm, normal rate, no murmur. S1 and S2 normal. No jugular venous distention.  Respiratory: clear to auscultation bilaterally GI : normal bowel sounds, soft and nontender. No distention.   MSK: extremities warm, well perfused. No edema.  NEURO: grossly nonfocal  exam, moves all extremities. PSYCH: alert and oriented x 3, normal mood and affect.   ASSESSMENT:    1. Paroxysmal atrial fibrillation (HCC)   2. Palpitations   3. PVC's (premature ventricular contractions)   4. Sinus bradycardia   5. Mixed hyperlipidemia     PLAN:    PAF/palpitations/PVCs/sinus bradycardia - ECG stable today, NSR. -No recurrence of atrial fibrillation or significant palpitations.  -Not currently on AV nodal blocking agents given prior sinus bradycardia, she does not need it at this time. - CHADS2VASC is now 2 for age and female. However, no recurrent afib, will observe. She will obtain [facility].  HLD -Crestor 10 mg daily, tolerating well. LDL 72, HDL 58, Trig 133 on 03/14/21.   Blood pressure -grossly normal, observe.  Follow-up:  6 months or sooner as needed.  Total time of encounter: 30 minutes total time of encounter, including 20 minutes spent in face-to-face patient care on the date of this encounter. This time includes coordination of care and counseling  regarding above mentioned problem list. Remainder of non-face-to-face time involved reviewing chart documents/testing relevant to the patient encounter and documentation in the medical record. I have independently reviewed documentation from referring provider.   Cherlynn Kaiser, MD, [facility]   Medication Adjustments/Labs and Tests Ordered: Current medicines are reviewed at length with the patient today.  Concerns regarding medicines are outlined above.   Orders Placed This Encounter  Procedures   EKG 12-Lead   No orders of the defined types were placed in this encounter.  Patient Instructions  Medication Instructions:  No Changes In Medications at this time.  *If you need a refill on your cardiac medications before your next appointment, please call your pharmacy*  Follow-Up: At [facility], you and your health needs are our priority.  As part of our continuing mission to provide you with exceptional heart care, we have created designated Provider Care Teams.  These Care Teams include your primary Cardiologist (physician) and Advanced Practice Providers (APPs -  Physician Assistants and Nurse Practitioners) who all work together to provide you with the care you need, when you need it.  Your next appointment:   6 month(s)  Provider:   Elouise Munroe, MD     PLEASE  CONSIDER GETTING A [place] CAN PURCHASE THIS ON AMAZON OR AT [place]       I,Mathew Stumpf,acting as a scribe for Elouise Munroe, MD.,have documented all relevant documentation on the behalf of Elouise Munroe, MD,as directed by  Elouise Munroe, MD while in the presence of Elouise Munroe, MD.  I, Elouise Munroe, MD, have reviewed all documentation for the visit on 03/27/2022. The documentation on today's date of service for the exam, diagnosis, procedures, and orders are all accurate and complete.

## 2022-03-27 ENCOUNTER — Encounter: Payer: Self-pay | Admitting: Internal Medicine

## 2022-03-27 ENCOUNTER — Ambulatory Visit: Payer: Medicare Other | Attending: Internal Medicine | Admitting: Internal Medicine

## 2022-03-27 VITALS — BP 132/72 | HR 61 | Ht 68.0 in | Wt 132.0 lb

## 2022-03-27 DIAGNOSIS — I493 Ventricular premature depolarization: Secondary | ICD-10-CM

## 2022-03-27 DIAGNOSIS — I480 Paroxysmal atrial fibrillation: Secondary | ICD-10-CM | POA: Diagnosis not present

## 2022-03-27 DIAGNOSIS — R002 Palpitations: Secondary | ICD-10-CM | POA: Diagnosis not present

## 2022-03-27 DIAGNOSIS — E782 Mixed hyperlipidemia: Secondary | ICD-10-CM

## 2022-03-27 DIAGNOSIS — R001 Bradycardia, unspecified: Secondary | ICD-10-CM

## 2022-03-27 NOTE — Patient Instructions (Signed)
Medication Instructions:  No Changes In Medications at this time.  *If you need a refill on your cardiac medications before your next appointment, please call your pharmacy*  Follow-Up: At [facility], you and your health needs are our priority.  As part of our continuing mission to provide you with exceptional heart care, we have created designated Provider Care Teams.  These Care Teams include your primary Cardiologist (physician) and Advanced Practice Providers (APPs -  Physician Assistants and Nurse Practitioners) who all work together to provide you with the care you need, when you need it.  Your next appointment:   6 month(s)  Provider:   Elouise Munroe, MD     PLEASE  CONSIDER GETTING A [place] THIS ON [employer] OR AT [place]

## 2022-04-09 DIAGNOSIS — K080 Exfoliation of teeth due to systemic causes: Secondary | ICD-10-CM | POA: Diagnosis not present

## 2022-04-10 DIAGNOSIS — H43813 Vitreous degeneration, bilateral: Secondary | ICD-10-CM | POA: Diagnosis not present

## 2022-05-14 DIAGNOSIS — Z01419 Encounter for gynecological examination (general) (routine) without abnormal findings: Secondary | ICD-10-CM | POA: Diagnosis not present

## 2022-05-14 DIAGNOSIS — Z6820 Body mass index (BMI) 20.0-20.9, adult: Secondary | ICD-10-CM | POA: Diagnosis not present

## 2022-05-14 DIAGNOSIS — M8588 Other specified disorders of bone density and structure, other site: Secondary | ICD-10-CM | POA: Diagnosis not present

## 2022-05-14 DIAGNOSIS — Z1231 Encounter for screening mammogram for malignant neoplasm of breast: Secondary | ICD-10-CM | POA: Diagnosis not present

## 2022-06-19 DIAGNOSIS — E782 Mixed hyperlipidemia: Secondary | ICD-10-CM | POA: Diagnosis not present

## 2022-06-19 DIAGNOSIS — E559 Vitamin D deficiency, unspecified: Secondary | ICD-10-CM | POA: Diagnosis not present

## 2022-06-19 DIAGNOSIS — E0590 Thyrotoxicosis, unspecified without thyrotoxic crisis or storm: Secondary | ICD-10-CM | POA: Diagnosis not present

## 2022-06-19 DIAGNOSIS — D649 Anemia, unspecified: Secondary | ICD-10-CM | POA: Diagnosis not present

## 2022-06-25 DIAGNOSIS — K080 Exfoliation of teeth due to systemic causes: Secondary | ICD-10-CM | POA: Diagnosis not present

## 2022-07-01 DIAGNOSIS — E782 Mixed hyperlipidemia: Secondary | ICD-10-CM | POA: Diagnosis not present

## 2022-07-01 DIAGNOSIS — Z789 Other specified health status: Secondary | ICD-10-CM | POA: Diagnosis not present

## 2022-07-01 DIAGNOSIS — G720 Drug-induced myopathy: Secondary | ICD-10-CM | POA: Diagnosis not present

## 2022-07-01 DIAGNOSIS — E559 Vitamin D deficiency, unspecified: Secondary | ICD-10-CM | POA: Diagnosis not present

## 2022-08-01 DIAGNOSIS — D2271 Melanocytic nevi of right lower limb, including hip: Secondary | ICD-10-CM | POA: Diagnosis not present

## 2022-08-01 DIAGNOSIS — Z85828 Personal history of other malignant neoplasm of skin: Secondary | ICD-10-CM | POA: Diagnosis not present

## 2022-08-01 DIAGNOSIS — C44519 Basal cell carcinoma of skin of other part of trunk: Secondary | ICD-10-CM | POA: Diagnosis not present

## 2022-08-01 DIAGNOSIS — L812 Freckles: Secondary | ICD-10-CM | POA: Diagnosis not present

## 2022-08-01 DIAGNOSIS — L570 Actinic keratosis: Secondary | ICD-10-CM | POA: Diagnosis not present

## 2022-08-01 DIAGNOSIS — D225 Melanocytic nevi of trunk: Secondary | ICD-10-CM | POA: Diagnosis not present

## 2022-09-10 IMAGING — MG MM DIGITAL DIAGNOSTIC UNILAT*L* W/ TOMO W/ CAD
4 series · 4 of 12 positions shown · non-contrast
Comparison: Previous exam(s).

CLINICAL DATA: Callback from screening mammogram for possible mass
left breast.

EXAM:
DIGITAL DIAGNOSTIC UNILATERAL LEFT MAMMOGRAM WITH TOMOSYNTHESIS AND
CAD; ULTRASOUND LEFT BREAST LIMITED
TECHNIQUE: Left digital diagnostic mammography and breast tomosynthesis was
performed. The images were evaluated with computer-aided detection.;
Targeted ultrasound examination of the left breast was performed.

[L CC synth-2D]
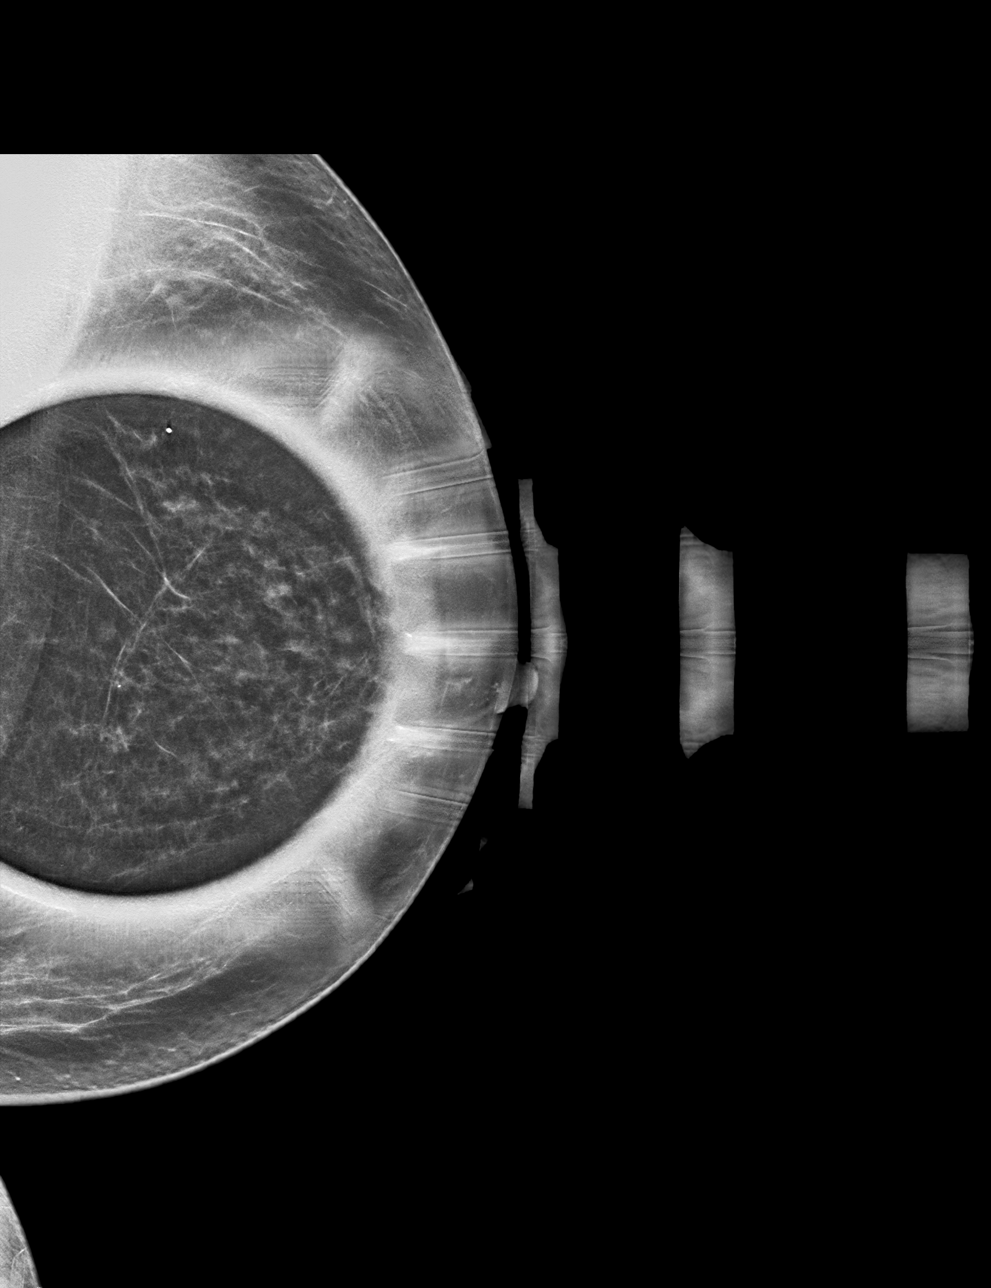

[L MLO synth-2D]
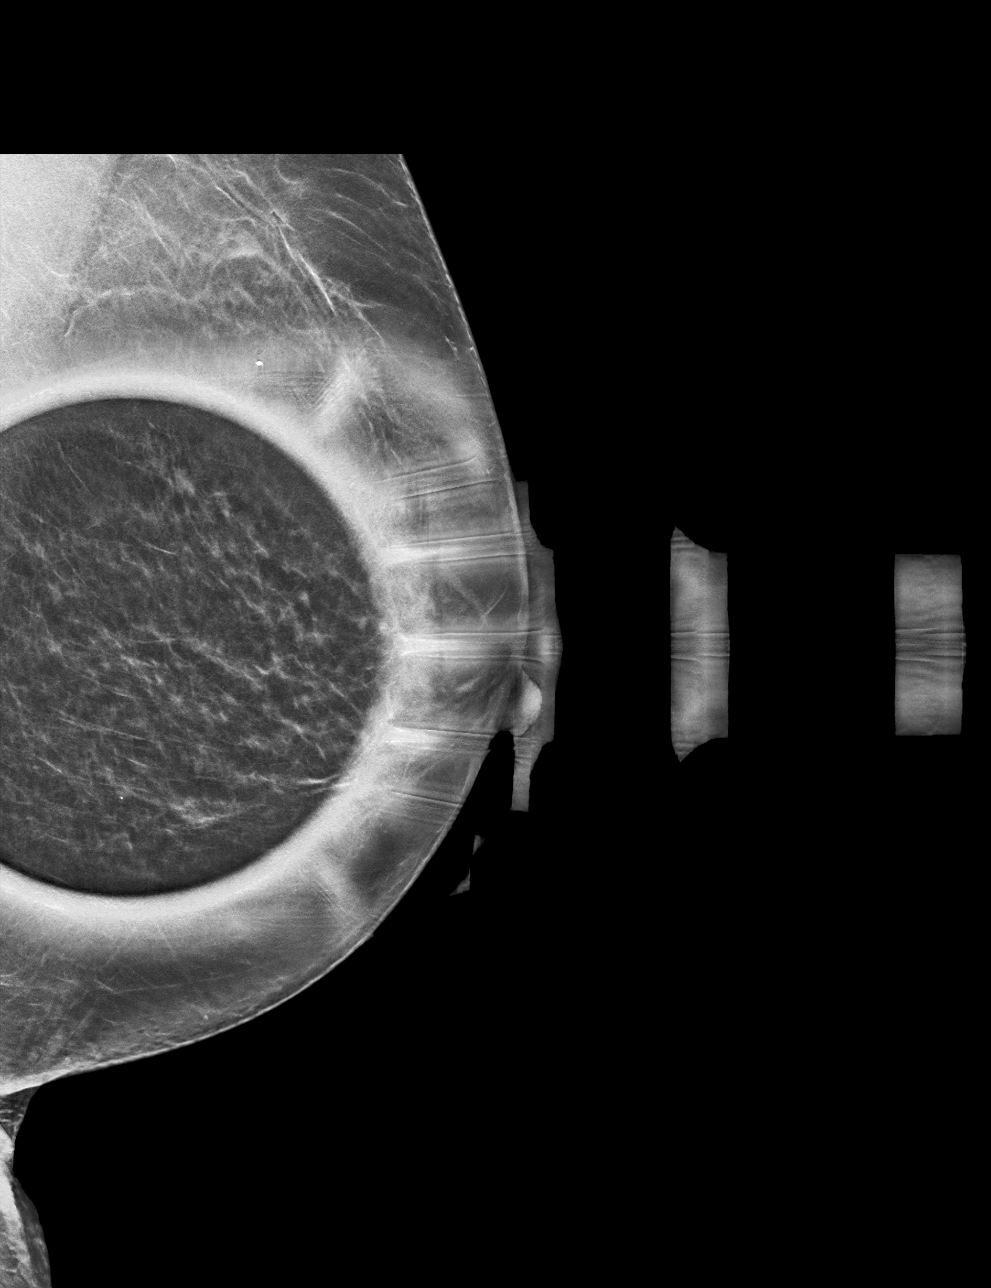

[L CC tomo · tomo slice 29/56.0]
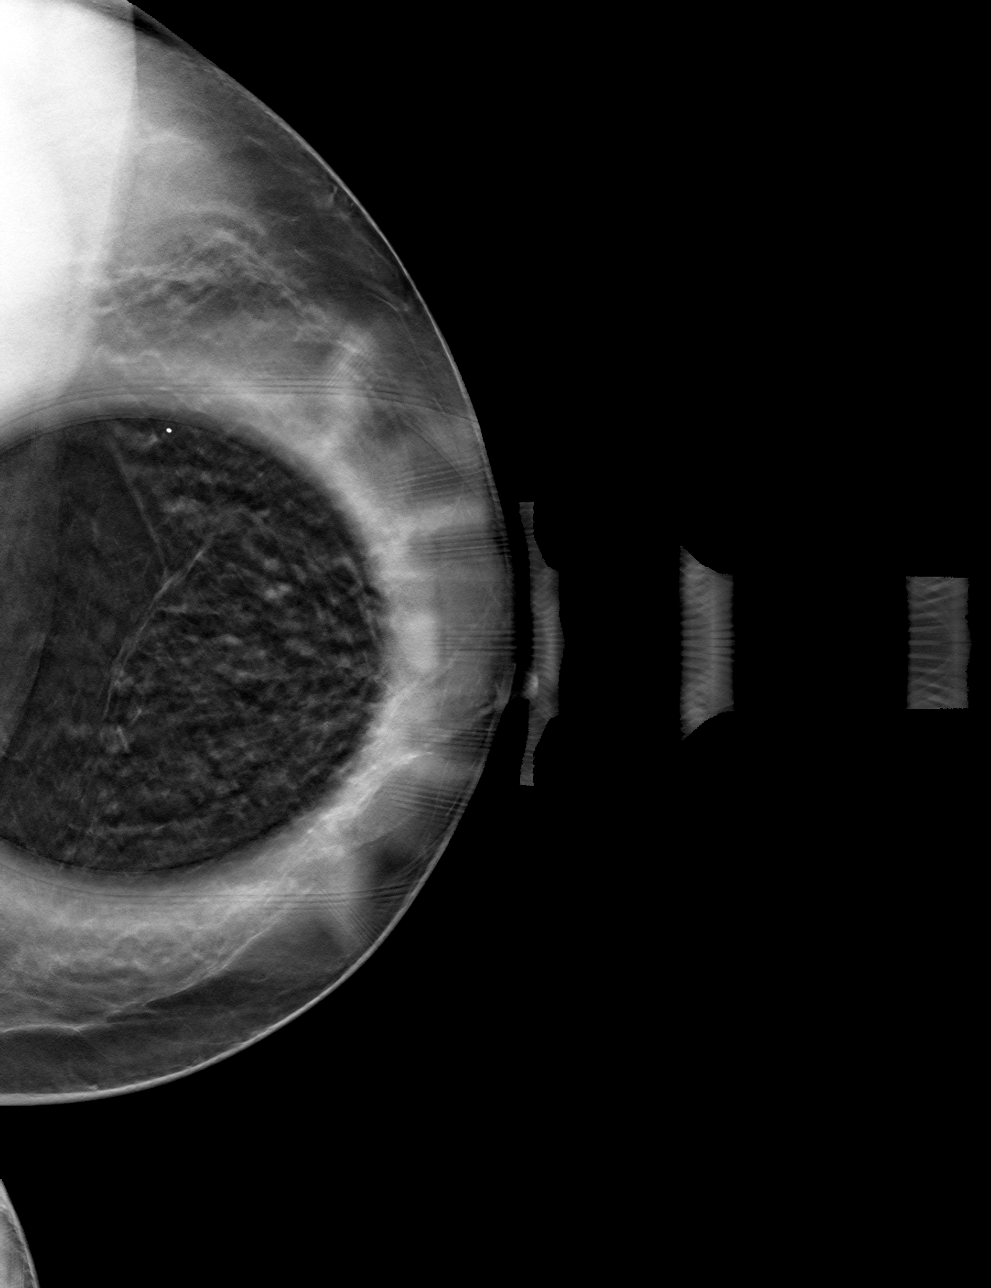

[L MLO tomo · tomo slice 26/51.0]
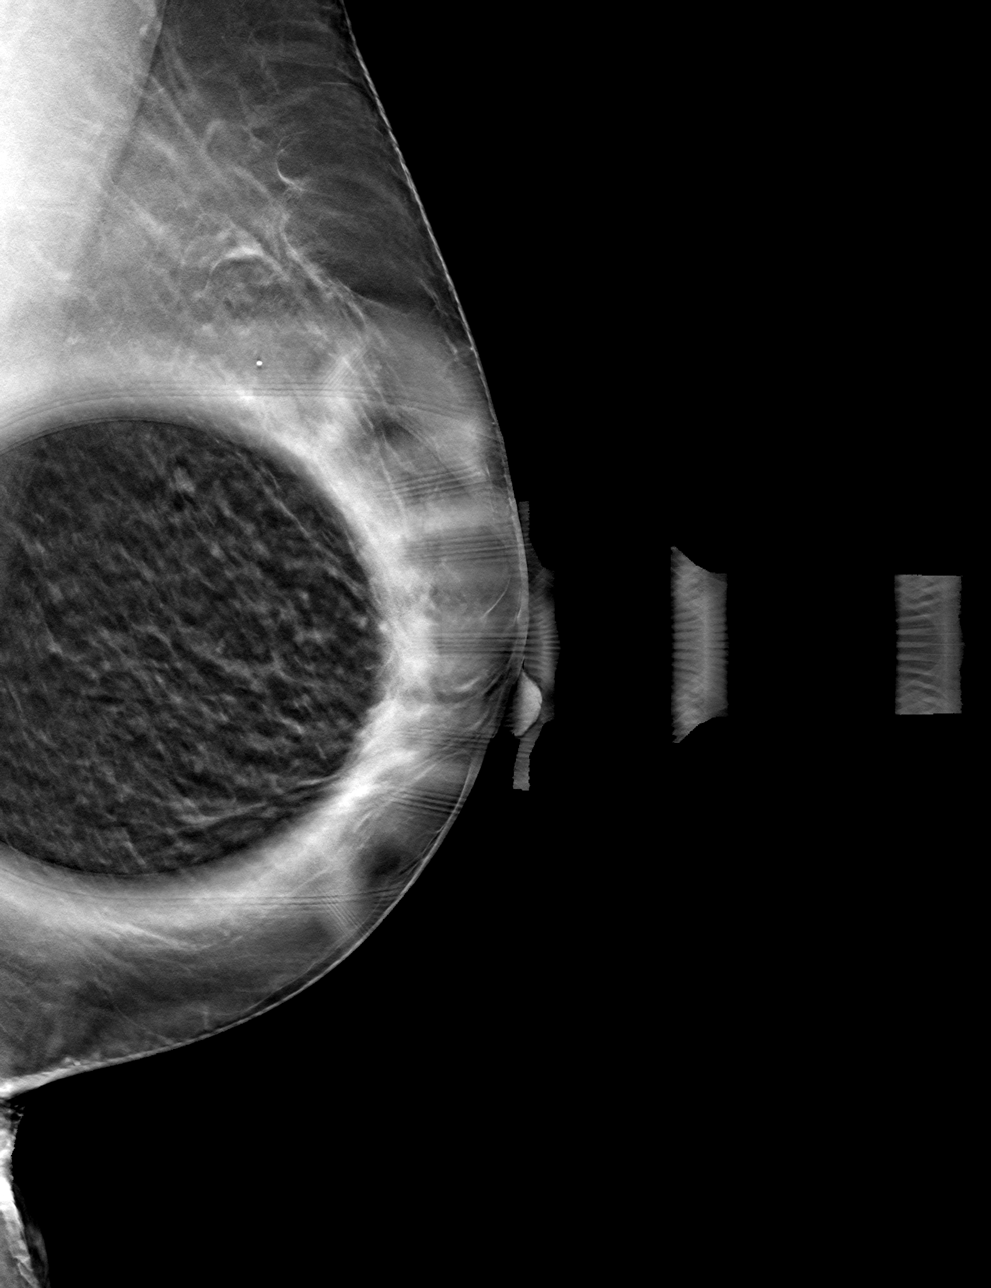

[4 of 12 positions shown; findings below may reference images not displayed]

ACR Breast Density Category b: There are scattered areas of
fibroglandular density.
FINDINGS: Spot compression cc and MLO views of the left breast are submitted.
Previously question asymmetry in the central posterior left breast
is persistent.

Targeted ultrasound is performed, showing 3 x 2 x 3 mm simple cyst
at the left breast 12 o'clock posterior subareolar region
correlating the asymmetry.
IMPRESSION: Benign findings.

RECOMMENDATION:
Routine screening back on schedule.

I have discussed the findings and recommendations with the patient.
If applicable, a reminder letter will be sent to the patient
regarding the next appointment.

BI-RADS CATEGORY  2: Benign.

## 2022-09-23 ENCOUNTER — Encounter: Payer: Self-pay | Admitting: Internal Medicine

## 2022-09-23 ENCOUNTER — Ambulatory Visit: Payer: Medicare Other | Attending: Internal Medicine | Admitting: Internal Medicine

## 2022-09-23 VITALS — BP 160/74 | HR 61 | Ht 68.5 in | Wt 135.0 lb

## 2022-09-23 DIAGNOSIS — R002 Palpitations: Secondary | ICD-10-CM

## 2022-09-23 DIAGNOSIS — I480 Paroxysmal atrial fibrillation: Secondary | ICD-10-CM

## 2022-09-23 DIAGNOSIS — R001 Bradycardia, unspecified: Secondary | ICD-10-CM

## 2022-09-23 NOTE — Progress Notes (Signed)
Cardiology Office Note:    Date:  09/23/2022  ID:  Carolyn Snyder, DOB July 26, 1955, MRN [id number]  PCP:  Lewis Moccasin, MD  Cardiologist:  Parke Poisson, MD  Electrophysiologist:  None   Referring MD: Lewis Moccasin, MD   Chief Complaint/Reason for Referral: PAF, PVCs  History of Present Illness:    Carolyn Snyder is a 67 y.o. female with a history of anxiety, migraine, HLD who is being seen today for follow-up of palpitations and atrial fibrillation. We attempted to use diltiazem for rate control, however she had symptomatic bradycardia, and we discontinued. She felt much better afterward. She wore the monitor after discontinuing rate control, but continued to have sinus bradycardia. Wore monitor for 2 weeks. Afib, sinus brady, and PVCs. Symptoms seem to correlate best with episodes of PVCs. We discussed that it may be difficult to treat PVCs with underlying bradycardia. Overall symptoms had improved with diet and lifestyle modification.  She is doing very well and is currently working on her crafts for an upcoming show.  She continues to eat a healthy diet with occasional indiscretions but is focusing on no caffeine, limiting alcohol, and heart healthy fats.  She occasionally has some palpitations on waking in the morning, these resolve after having a bowel movement, we discussed that this could be related to vagal stimulation.  Overall nonworrisome and she does have a [employer] mobile device and will use this if she feels curious about the palpitations.  Blood pressure mildly elevated at home, and mildly elevated in the office, she attributes elevated blood pressure in the office to whitecoat hypertension.  She experienced sinus bradycardia with AV nodal blocking therapy used for PVCs and palpitations.  With mildly elevated blood pressure she is focusing on lifestyle factors.  Diastolic blood pressures in the 50s and 60s at home.  Denies chest pain, denies shortness of breath.   Continues to exercise with a friend at [employer] and also continues to do yoga.  Is managing her stress very well.   Past Medical History:  Diagnosis Date   Abnormal Pap smear    Anxiety    Hyperlipidemia    Menopause 2011   Migraine     Past Surgical History:  Procedure Laterality Date   cryotheraphy  1988   KNEE SURGERY  1979   removed hemangioma with calcium deposit   MOHS SURGERY  2012   skin cancer removed nose   TEMPOROMANDIBULAR JOINT SURGERY  1990    Current Medications: Current Meds  Medication Sig   cholecalciferol (VITAMIN D3) 25 MCG (1000 UNIT) tablet Take 1,000 Units by mouth daily.   rosuvastatin (CRESTOR) 10 MG tablet Take 10 mg by mouth daily.     Allergies:   Penicillins   Social History   Tobacco Use   Smoking status: Never   Smokeless tobacco: Never  Substance Use Topics   Alcohol use: Yes    Comment: socially   Drug use: No     Family History: The patient's family history includes Breast cancer (age of onset: 56) in her cousin; Heart disease in her father and mother; Stroke in her maternal grandmother and mother.  ROS:   Please see the history of present illness.    All other systems reviewed and are negative.  EKGs/Labs/Other Studies Reviewed:    The following studies were reviewed today:  Monitor 02/2019: Indication: afib and palpitations   Minimum HR (bpm): 43 Maximum HR (bpm): 153   Supraventricular Ectopy: <1%  Ventricular Ectopy: 2% NSVT: none Ventricular Tachycardia: none   Pauses: none AV block: none    Atrial fibrillation: 5 hrs, 46 mins of atrial fibrillation, 40% of which was with rapid ventricular response.  Diary events: fluttering and skipped beats, lightheadedness, associated with PVCs   IMPRESSION: Atrial fibrillation detected. Symptoms of fluttering, skipped beats and lightheadedness temporally correlates with episodes of frequent PVCs.  Echo 01/21/2019:  1. Left ventricular ejection fraction, by visual  estimation, is 60 to  65%. The left ventricle has normal function. There is no left ventricular  hypertrophy.   2. Global right ventricle has normal systolic function.The right  ventricular size is normal.   3. Left atrial size was normal.   4. Right atrial size was normal.   5. Trivial pericardial effusion is present.   6. The mitral valve is normal in structure. No evidence of mitral valve  regurgitation.   7. The tricuspid valve is normal in structure.   8. The aortic valve is tricuspid. Aortic valve regurgitation is not  visualized. No evidence of aortic valve sclerosis or stenosis.   9. The pulmonic valve was not well visualized. Pulmonic valve  regurgitation is not visualized.  10. TR signal is inadequate for assessing pulmonary artery systolic  pressure.  11. The inferior vena cava is normal in size with greater than 50%  respiratory variability, suggesting right atrial pressure of 3 mmHg.   EKG:  EKG is personally reviewed. EKG Interpretation Date/Time:  Monday September 23 2022 10:05:38 EDT Ventricular Rate:  61 PR Interval:  152 QRS Duration:  86 QT Interval:  436 QTC Calculation: 438 R Axis:   43  Text Interpretation: Normal sinus rhythm Nonspecific ST abnormality Confirmed by Weston Brass ([id number]) on 09/23/2022 10:15:27 AM   03/27/2022:  Sinus rhythm. Normal rate.  09/24/21: NSR 04/13/2021: Sinus rhythm. Rate 63 bpm. 09/15/2020: NSR, nonspecific ST abnormality  Recent Labs: No results found for requested labs within last 365 days.   Recent Lipid Panel    Component Value Date/Time   CHOL 235 (H) 06/17/2011 1112   TRIG 155 (H) 06/17/2011 1112   HDL 58 06/17/2011 1112   CHOLHDL 4.1 06/17/2011 1112   VLDL 31 06/17/2011 1112   LDLCALC 146 (H) 06/17/2011 1112    Physical Exam:    VS:  BP (!) 160/74   Pulse 61   Ht 5' 8.5" (1.74 m)   Wt 135 lb (61.2 kg)   SpO2 99%   BMI 20.23 kg/m     Wt Readings from Last 5 Encounters:  09/23/22 135 lb (61.2 kg)   03/27/22 132 lb (59.9 kg)  09/24/21 134 lb (60.8 kg)  04/13/21 136 lb 9.6 oz (62 kg)  09/15/20 140 lb (63.5 kg)    Constitutional: No acute distress Eyes: sclera non-icteric, normal conjunctiva and lids ENMT: normal dentition, moist mucous membranes Cardiovascular: regular rhythm, normal rate, no murmur. S1 and S2 normal. No jugular venous distention.  Respiratory: clear to auscultation bilaterally GI : normal bowel sounds, soft and nontender. No distention.   MSK: extremities warm, well perfused. No edema.  NEURO: grossly nonfocal exam, moves all extremities. PSYCH: alert and oriented x 3, normal mood and affect.   ASSESSMENT:    1. Sinus bradycardia   2. Paroxysmal atrial fibrillation (HCC)   3. Palpitations     PLAN:    PAF/palpitations/PVCs/sinus bradycardia - ECG stable today, NSR. -No recurrence of atrial fibrillation or significant palpitations.  She has a [employer], dispensing mobile which she can use  if she has sustained palpitations. -Not currently on AV nodal blocking agents given prior sinus bradycardia, she does not need it at this time. - CHADS2VASC is now 2 for age and female. However, no recurrent afib, will observe.   HLD -Crestor 10 mg daily, tolerating well. LDL 72, HDL 58, Trig 133 on 03/14/21.  She will send me updated labs at her next PCP visit but recent labs from the summer were reportedly normal and no change from prior.  Blood pressure -mildly elevated, continue to focus on lifestyle measures.  Can consider very low-dose medical therapy though diastolic blood pressure is 50s and 60s at home which may preclude medical therapy for mild elevation in systolic blood pressure.  Follow-up:  6 months or sooner as needed.  Total time of encounter: 30 minutes total time of encounter, including 20 minutes spent in face-to-face patient care on the date of this encounter. This time includes coordination of care and counseling regarding above mentioned problem list. Remainder of  non-face-to-face time involved reviewing chart documents/testing relevant to the patient encounter and documentation in the medical record. I have independently reviewed documentation from referring provider.   Weston Brass, MD, [facility]  [facility]   Medication Adjustments/Labs and Tests Ordered: Current medicines are reviewed at length with the patient today.  Concerns regarding medicines are outlined above.   Orders Placed This Encounter  Procedures   EKG 12-Lead   No orders of the defined types were placed in this encounter.  Patient Instructions  Medication Instructions:  NO CHANGES  *If you need a refill on your cardiac medications before your next appointment, please call your pharmacy*  Follow-Up: At [facility], you and your health needs are our priority.  As part of our continuing mission to provide you with exceptional heart care, we have created designated Provider Care Teams.  These Care Teams include your primary Cardiologist (physician) and Advanced Practice Providers (APPs -  Physician Assistants and Nurse Practitioners) who all work together to provide you with the care you need, when you need it.  We recommend signing up for the patient portal called "MyChart".  Sign up information is provided on this After Visit Summary.  MyChart is used to connect with patients for Virtual Visits (Telemedicine).  Patients are able to view lab/test results, encounter notes, upcoming appointments, etc.  Non-urgent messages can be sent to your provider as well.   To learn more about what you can do with MyChart, go to [email or web address].    Your next appointment:    6 months with Dr. Jacques Navy

## 2022-09-23 NOTE — Patient Instructions (Signed)
Medication Instructions:  NO CHANGES  *If you need a refill on your cardiac medications before your next appointment, please call your pharmacy*   Follow-Up: At [facility], you and your health needs are our priority.  As part of our continuing mission to provide you with exceptional heart care, we have created designated Provider Care Teams.  These Care Teams include your primary Cardiologist (physician) and Advanced Practice Providers (APPs -  Physician Assistants and Nurse Practitioners) who all work together to provide you with the care you need, when you need it.  We recommend signing up for the patient portal called "MyChart".  Sign up information is provided on this After Visit Summary.  MyChart is used to connect with patients for Virtual Visits (Telemedicine).  Patients are able to view lab/test results, encounter notes, upcoming appointments, etc.  Non-urgent messages can be sent to your provider as well.   To learn more about what you can do with MyChart, go to [email or web address].    Your next appointment:    6 months with Dr. Jacques Navy

## 2022-10-07 DIAGNOSIS — Z0000 Encounter for general adult medical examination without abnormal findings: Secondary | ICD-10-CM | POA: Diagnosis not present

## 2022-10-07 DIAGNOSIS — G4700 Insomnia, unspecified: Secondary | ICD-10-CM | POA: Diagnosis not present

## 2022-10-07 DIAGNOSIS — Z1331 Encounter for screening for depression: Secondary | ICD-10-CM | POA: Diagnosis not present

## 2022-10-07 DIAGNOSIS — Z1339 Encounter for screening examination for other mental health and behavioral disorders: Secondary | ICD-10-CM | POA: Diagnosis not present

## 2022-10-07 DIAGNOSIS — F331 Major depressive disorder, recurrent, moderate: Secondary | ICD-10-CM | POA: Diagnosis not present

## 2022-10-07 DIAGNOSIS — Z23 Encounter for immunization: Secondary | ICD-10-CM | POA: Diagnosis not present

## 2022-10-07 DIAGNOSIS — F411 Generalized anxiety disorder: Secondary | ICD-10-CM | POA: Diagnosis not present

## 2022-10-07 DIAGNOSIS — R000 Tachycardia, unspecified: Secondary | ICD-10-CM | POA: Diagnosis not present

## 2022-10-21 DIAGNOSIS — K080 Exfoliation of teeth due to systemic causes: Secondary | ICD-10-CM | POA: Diagnosis not present

## 2023-01-28 DIAGNOSIS — K080 Exfoliation of teeth due to systemic causes: Secondary | ICD-10-CM | POA: Diagnosis not present

## 2023-02-04 DIAGNOSIS — Z85828 Personal history of other malignant neoplasm of skin: Secondary | ICD-10-CM | POA: Diagnosis not present

## 2023-02-04 DIAGNOSIS — D2261 Melanocytic nevi of right upper limb, including shoulder: Secondary | ICD-10-CM | POA: Diagnosis not present

## 2023-02-04 DIAGNOSIS — L570 Actinic keratosis: Secondary | ICD-10-CM | POA: Diagnosis not present

## 2023-02-04 DIAGNOSIS — L812 Freckles: Secondary | ICD-10-CM | POA: Diagnosis not present

## 2023-02-10 ENCOUNTER — Telehealth: Payer: Self-pay | Admitting: Internal Medicine

## 2023-02-10 NOTE — Telephone Encounter (Signed)
Patient c/o Palpitations:  STAT if patient reporting lightheadedness, shortness of breath, or chest pain  How long have you had palpitations/irregular HR/ Afib? Are you having the symptoms now? Afib episode Friday night 02/07/23  Are you currently experiencing lightheadedness, SOB or CP? No symptoms, just high HR  Do you have a history of afib (atrial fibrillation) or irregular heart rhythm? yes  Have you checked your BP or HR? (document readings if available): yes, HR on Friday night was 175bpm and per [facility] she was in afib. HR did go down to 103 bpm and her [facility] was showing Tachycardia.  Are you experiencing any other symptoms? She did have to pee a lot during her episode   Patient took propanolol per advice from her friend who is an Nutritional therapist. Woke up Saturday morning and checked her [facility] and her HR was 68 with normal sinus rhythm. Patient has appt with Dr. Jacques Navy on 03/19 but not sure if she needs to come in sooner.

## 2023-02-11 NOTE — Telephone Encounter (Addendum)
Spoke to pt regarding Afib episode (HR 170's and Afib per kardia mobile).  Pt states she was at work and had eaten a lot of Sugar, then ate a cookie at bedtime/10pm at home.  She has also been under some stress and has not had the usual amt of sleep, with a [place] of state) and a bathroom remodel. The only symptom she had during the Afib were palpitations; NO Pain, NO dizziness, NO SOB, NO nausea.  After talking to her friend (ER nurse) she decided to take a dose of her own Propranolol (that she had previously been prescribed but not currently taking) and thinks it did help since her HR subsided and she felt better afterwards.  She would like to keep her 04/02/2023 OV w/Dr. Jacques Navy for now and will get in touch w/us if she has any further episodes or additional symptoms.  She would like to send in the EKG, but is unsure how to do this; will send her the instructions via MyCHart.

## 2023-04-02 ENCOUNTER — Ambulatory Visit: Payer: Medicare Other | Attending: Internal Medicine | Admitting: Internal Medicine

## 2023-04-02 ENCOUNTER — Ambulatory Visit (INDEPENDENT_AMBULATORY_CARE_PROVIDER_SITE_OTHER)

## 2023-04-02 ENCOUNTER — Encounter: Payer: Self-pay | Admitting: *Deleted

## 2023-04-02 VITALS — BP 130/70 | HR 66 | Ht 68.5 in | Wt 129.0 lb

## 2023-04-02 DIAGNOSIS — Z79899 Other long term (current) drug therapy: Secondary | ICD-10-CM

## 2023-04-02 DIAGNOSIS — R002 Palpitations: Secondary | ICD-10-CM

## 2023-04-02 DIAGNOSIS — R001 Bradycardia, unspecified: Secondary | ICD-10-CM | POA: Diagnosis not present

## 2023-04-02 DIAGNOSIS — R0683 Snoring: Secondary | ICD-10-CM

## 2023-04-02 DIAGNOSIS — I480 Paroxysmal atrial fibrillation: Secondary | ICD-10-CM

## 2023-04-02 DIAGNOSIS — D6869 Other thrombophilia: Secondary | ICD-10-CM

## 2023-04-02 DIAGNOSIS — I493 Ventricular premature depolarization: Secondary | ICD-10-CM | POA: Diagnosis not present

## 2023-04-02 DIAGNOSIS — E782 Mixed hyperlipidemia: Secondary | ICD-10-CM

## 2023-04-02 MED ORDER — APIXABAN 5 MG PO TABS: 5.0000 mg | ORAL_TABLET | Freq: Two times a day (BID) | ORAL | 3 refills | Status: AC

## 2023-04-02 MED ORDER — PROPRANOLOL HCL 20 MG PO TABS: 20.0000 mg | ORAL_TABLET | Freq: Every day | ORAL | 3 refills | Status: AC

## 2023-04-02 MED ORDER — APIXABAN 5 MG PO TABS: 5.0000 mg | ORAL_TABLET | Freq: Two times a day (BID) | ORAL | Status: DC

## 2023-04-02 NOTE — Progress Notes (Unsigned)
 Enrolled patient for a 14 day Zio XT  monitor to be mailed to patients home

## 2023-04-02 NOTE — Patient Instructions (Signed)
 Medication Instructions:  Start: Apixiban (Eliquis) 5 mg (one tablet) two times daily.  Start: Propranolol (Inderal) 20 mg (one tablet), once daily at bedtime.  Lab Work: None  Testing/Procedures:  Christena Deem- Long Term Monitor Instructions  Your physician has requested you wear a ZIO patch monitor for 14 days.  This is a single patch monitor. [employer] supplies one patch monitor per enrollment. Additional stickers are not available. Please do not apply patch if you will be having a Nuclear Stress Test,  Echocardiogram, Cardiac CT, MRI, or Chest Xray during the period you would be wearing the  monitor. The patch cannot be worn during these tests. You cannot remove and re-apply the  ZIO XT patch monitor.  Your ZIO patch monitor will be mailed 3 day USPS to your address on file. It may take 3-5 days  to receive your monitor after you have been enrolled.  Once you have received your monitor, please review the enclosed instructions. Your monitor  has already been registered assigning a specific monitor serial # to you.  Billing and Patient Assistance Program Information  We have supplied [employer] with any of your insurance information on file for billing purposes. [employer] offers a sliding scale Patient Assistance Program for patients that do not have  insurance, or whose insurance does not completely cover the cost of the ZIO monitor.  You must apply for the Patient Assistance Program to qualify for this discounted rate.  To apply, please call [employer] at [phone], select option 4, select option 2, ask to apply for  Patient Assistance Program. Meredeth Ide will ask your household income, and how many people  are in your household. They will quote your out-of-pocket cost based on that information.  [employer] will also be able to set up a 52-month, interest-free payment plan if needed.  Applying the monitor   Shave hair from upper left chest.  Hold abrader disc by orange tab. Rub abrader in 40  strokes over the upper left chest as  indicated in your monitor instructions.  Clean area with 4 enclosed alcohol pads. Let dry.  Apply patch as indicated in monitor instructions. Patch will be placed under collarbone on left  side of chest with arrow pointing upward.  Rub patch adhesive wings for 2 minutes. Remove white label marked "1". Remove the white  label marked "2". Rub patch adhesive wings for 2 additional minutes.  While looking in a mirror, press and release button in center of patch. A small green light will  flash 3-4 times. This will be your only indicator that the monitor has been turned on.  Do not shower for the first 24 hours. You may shower after the first 24 hours.  Press the button if you feel a symptom. You will hear a small click. Record Date, Time and  Symptom in the Patient Logbook.  When you are ready to remove the patch, follow instructions on the last 2 pages of Patient  Logbook. Stick patch monitor onto the last page of Patient Logbook.  Place Patient Logbook in the blue and white box. Use locking tab on box and tape box closed  securely. The blue and white box has prepaid postage on it. Please place it in the mailbox as  soon as possible. Your physician should have your test results approximately 7 days after the  monitor has been mailed back to [facility].  Call [facility] Customer Care at [phone] if you have questions regarding  your ZIO XT patch monitor. Call them  immediately if you see an orange light blinking on your  monitor.  If your monitor falls off in less than 4 days, contact our Monitor department at [phone].  If your monitor becomes loose or falls off after 4 days call [employer] at [phone] for  suggestions on securing your monitor  --------------------------------------------------------------------  WatchPAT?  Is a FDA cleared portable home sleep study test that uses a watch and 3 points of contact to monitor 7 different  channels, including your heart rate, oxygen saturations, body position, snoring, and chest motion.  The study is easy to use from the comfort of your own home and accurately detect sleep apnea.  Before bed, you attach the chest sensor, attached the sleep apnea bracelet to your nondominant hand, and attach the finger probe.  After the study, the raw data is downloaded from the watch and scored for apnea events.   For more information: [email or web address]  Patient Testing Instructions:  Do not put battery into the device until bedtime when you are ready to begin the test. Please call the support number if you need assistance after following the instructions below: 24 hour support line- [phone] or [facility] support at [phone] (option 2)  Download the IntelWatchPAT One" app through the google play store or App Store  Be sure to turn on or enable access to bluetooth in settlings on your smartphone/ device  Make sure no other bluetooth devices are on and within the vicinity of your smartphone/ device and WatchPAT watch during testing.  Make sure to leave your smart phone/ device plugged in and charging all night.  When ready for bed:  Follow the instructions step by step in the WatchPAT One App to activate the testing device. For additional instructions, including video instruction, visit the WatchPAT One video on Youtube. You can search for WatchPat One within Youtube (video is 4 minutes and 18 seconds) or enter: https://youtube/[email or web address]?[email or web address] Please note: You will be prompted to enter a Pin to connect via bluetooth when starting the test. The PIN will be assigned to you when you receive the test.  The device is disposable, but it recommended that you retain the device until you receive a call letting you know the study has been received and the results have been interpreted.  We will let you know if the study did not transmit to [place] properly after the test is completed.  You do not need to call us to confirm the receipt of the test.  Please complete the test within 48 hours of receiving PIN.   Frequently Asked Questions:  What is Watch Dennie Bible one?  A single use fully disposable home sleep apnea testing device and will not need to be returned after completion.  What are the requirements to use WatchPAT one?  The be able to have a successful watchpat one sleep study, you should have your Watch pat one device, your smart phone, watch pat one app, your PIN number and Internet access What type of phone do I need?  You should have a smart phone that uses Android 5.1 and above or any Iphone with IOS 10 and above How can I download the WatchPAT one app?  Based on your device type search for WatchPAT one app either in google play for android devices or APP store for Iphone's Where will I get my PIN for the study?  Your PIN will be provided by your physician's office. It is used for authentication and if you lose/forget your PIN, please reach out  to your providers office.  I do not have Internet at home. Can I do WatchPAT one study?  WatchPAT One needs Internet connection throughout the night to be able to transmit the sleep data. You can use your home/local internet or your cellular's data package. However, it is always recommended to use home/local Internet. It is estimated that between 20MB-30MB will be used with each study.However, the application will be looking for space in the phone to start the study.  What happens if I lose internet or bluetooth connection?  During the internet disconnection, your phone will not be able to transmit the sleep data. All the data, will be stored in your phone. As soon as the internet connection is back on, the phone will being sending the sleep data. During the bluetooth disconnection, WatchPAT one will not be able to to send the sleep data to your phone. Data will be kept in the [facility] one until two devices have bluetooth  connection back on. As soon as the connection is back on, WatchPAT one will send the sleep data to the phone.  How long do I need to wear the WatchPAT one?  After you start the study, you should wear the device at least 6 hours.  How far should I keep my phone from the device?  During the night, your phone should be within 15 feet.  What happens if I leave the room for restroom or other reasons?  Leaving the room for any reason will not cause any problem. As soon as your get back to the room, both devices will reconnect and will continue to send the sleep data. Can I use my phone during the sleep study?  Yes, you can use your phone as usual during the study. But it is recommended to put your watchpat one on when you are ready to go to bed.  How will I get my study results?  A soon as you completed your study, your sleep data will be sent to the provider. They will then share the results with you when they are ready.     Follow-Up: At [facility], you and your health needs are our priority.  As part of our continuing mission to provide you with exceptional heart care, we have created designated Provider Care Teams.  These Care Teams include your primary Cardiologist (physician) and Advanced Practice Providers (APPs -  Physician Assistants and Nurse Practitioners) who all work together to provide you with the care you need, when you need it.   Your next appointment:   3 month(s)    Provider:   Parke Poisson, MD

## 2023-04-02 NOTE — Progress Notes (Signed)
 Cardiology Office Note:    Date:  04/02/2023  ID:  Carolyn Snyder, DOB [birth year], MRN [id number]  PCP:  Lewis Moccasin, MD  Cardiologist:  Parke Poisson, MD  Electrophysiologist:  None   Referring MD: Lewis Moccasin, MD   Chief Complaint/Reason for Referral: PAF, PVCs  History of Present Illness:    Carolyn Snyder is a 68 y.o. female with a history of anxiety, migraine, HLD who is being seen today for follow-up of palpitations and atrial fibrillation. We attempted to use diltiazem for rate control, however she had symptomatic bradycardia, and we discontinued. She felt much better afterward. She wore the monitor after discontinuing rate control, but continued to have sinus bradycardia. Wore monitor for 2 weeks. Afib, sinus brady, and PVCs. Symptoms seem to correlate best with episodes of PVCs. We discussed that it may be difficult to treat PVCs with underlying bradycardia. Overall symptoms had improved with diet and lifestyle modification.  She continues to eat a healthy diet with occasional indiscretions but is focusing on no caffeine, limiting alcohol, and heart healthy fats.  She occasionally has some palpitations on waking in the morning, these resolve after having a bowel movement, we discussed that this could be related to vagal stimulation.  Overall nonworrisome and she does have a [employer] mobile device and will use this if she feels curious about the palpitations.  04/02/23 She had at least 2 recurrences of afib. Used [employer]. Took propranolol for each with old prescription and this shortened duration of symptoms. We discussed afib in detail today.  Need to arrange home sleep test for snoring and afib.  TSH - we will draw.  Propranolol 20 mg at bedtime, need to try this given improvement in symptoms and may help with mild htn. Heart monitor 2 week to monitor for monitor for nocturnal bradycardia and afib burden.  Eliquis 5 mg BID to start today, no  contraindications.   Blood pressure mildly elevated at home, and mildly elevated in the office..  She experienced sinus bradycardia with AV nodal blocking therapy used for PVCs and palpitations.  With mildly elevated blood pressure she is focusing on lifestyle factors.   Denies chest pain, denies shortness of breath.  Denies syncope.  Past Medical History:  Diagnosis Date   Abnormal Pap smear    Anxiety    Hyperlipidemia    Menopause 2011   Migraine     Past Surgical History:  Procedure Laterality Date   cryotheraphy  1988   KNEE SURGERY  1979   removed hemangioma with calcium deposit   MOHS SURGERY  2012   skin cancer removed nose   TEMPOROMANDIBULAR JOINT SURGERY  1990    Current Medications: Current Meds  Medication Sig   cholecalciferol (VITAMIN D3) 25 MCG (1000 UNIT) tablet Take 1,000 Units by mouth daily.   rosuvastatin (CRESTOR) 10 MG tablet Take 10 mg by mouth daily.     Allergies:   Penicillins   Social History   Tobacco Use   Smoking status: Never   Smokeless tobacco: Never  Substance Use Topics   Alcohol use: Yes    Comment: socially   Drug use: No     Family History: The patient's family history includes Breast cancer (age of onset: 61) in her cousin; Heart disease in her father and mother; Stroke in her maternal grandmother and mother.  ROS:   Please see the history of present illness.    All other systems reviewed and are negative.  EKGs/Labs/Other Studies Reviewed:    The following studies were reviewed today:  Monitor 02/2019: Indication: afib and palpitations   Minimum HR (bpm): 43 Maximum HR (bpm): 153   Supraventricular Ectopy: <1%   Ventricular Ectopy: 2% NSVT: none Ventricular Tachycardia: none   Pauses: none AV block: none    Atrial fibrillation: 5 hrs, 46 mins of atrial fibrillation, 40% of which was with rapid ventricular response.  Diary events: fluttering and skipped beats, lightheadedness, associated with PVCs    IMPRESSION: Atrial fibrillation detected. Symptoms of fluttering, skipped beats and lightheadedness temporally correlates with episodes of frequent PVCs.  Echo 01/21/2019:  1. Left ventricular ejection fraction, by visual estimation, is 60 to  65%. The left ventricle has normal function. There is no left ventricular  hypertrophy.   2. Global right ventricle has normal systolic function.The right  ventricular size is normal.   3. Left atrial size was normal.   4. Right atrial size was normal.   5. Trivial pericardial effusion is present.   6. The mitral valve is normal in structure. No evidence of mitral valve  regurgitation.   7. The tricuspid valve is normal in structure.   8. The aortic valve is tricuspid. Aortic valve regurgitation is not  visualized. No evidence of aortic valve sclerosis or stenosis.   9. The pulmonic valve was not well visualized. Pulmonic valve  regurgitation is not visualized.  10. TR signal is inadequate for assessing pulmonary artery systolic  pressure.  11. The inferior vena cava is normal in size with greater than 50%  respiratory variability, suggesting right atrial pressure of 3 mmHg.   EKG:  EKG is personally reviewed. EKG Interpretation Date/Time:  Wednesday April 02 2023 09:29:02 EDT Ventricular Rate:  66 PR Interval:  140 QRS Duration:  88 QT Interval:  410 QTC Calculation: 429 R Axis:   8  Text Interpretation: Normal sinus rhythm Normal ECG When compared with ECG of 23-Sep-2022 10:05, No significant change was found Confirmed by Weston Brass ([id number]) on 04/02/2023 9:45:09 AM    Recent Labs: No results found for requested labs within last 365 days.   Recent Lipid Panel    Component Value Date/Time   CHOL 235 (H) 06/17/2011 1112   TRIG 155 (H) 06/17/2011 1112   HDL 58 06/17/2011 1112   CHOLHDL 4.1 06/17/2011 1112   VLDL 31 06/17/2011 1112   LDLCALC 146 (H) 06/17/2011 1112    Physical Exam:    VS:  BP 130/70 (BP Location: Left Arm,  Patient Position: Sitting, Cuff Size: Normal)   Pulse 66   Ht 5' 8.5" (1.74 m)   Wt 129 lb (58.5 kg)   BMI 19.33 kg/m     Wt Readings from Last 5 Encounters:  04/02/23 129 lb (58.5 kg)  09/23/22 135 lb (61.2 kg)  03/27/22 132 lb (59.9 kg)  09/24/21 134 lb (60.8 kg)  04/13/21 136 lb 9.6 oz (62 kg)    Constitutional: No acute distress Eyes: sclera non-icteric, normal conjunctiva and lids ENMT: normal dentition, moist mucous membranes Cardiovascular: regular rhythm, normal rate, no murmur. S1 and S2 normal. No jugular venous distention.  Respiratory: clear to auscultation bilaterally GI : normal bowel sounds, soft and nontender. No distention.   MSK: extremities warm, well perfused. No edema.  NEURO: grossly nonfocal exam, moves all extremities. PSYCH: alert and oriented x 3, normal mood and affect.   ASSESSMENT:    1. Paroxysmal atrial fibrillation (HCC)   2. PVC's (premature ventricular contractions)   3.  Palpitations   4. Sinus bradycardia   5. Secondary hypercoagulable state (HCC)   6. Medication management     PLAN:    PAF/palpitations/PVCs/sinus bradycardia Medication mgmt Secondary hypercoagulable state   - ECG stable today, NSR. -recurrence of atrial fibrillation with palpitations.  She has a [employer], dispensing mobile which she can use if she has sustained palpitations. This is where she documented afib. Treated with propranolol - with recurrence we will start eliquis for CHADS2VASC is now 3 for age and female. Start Eliquis 5 mg BID today and provide samples. - discussed meds in detail, she tolerates propranolol without significant bradycardia, prefers to take at night, will start propranolol 20 mg daily at bedtime, recall patient did not tolerate diltiazem for bradycardia and hypotension.  - cardiac monitor to assess overall afib burden and nocturnal bradycardia for safety of AVN blocking therapy   HLD -Crestor 10 mg daily, tolerating well. Continue.   Blood pressure  -mildly elevated, with afib will plan for addition of propranolol and monitor, propranolol 20 mg at bedtime.   Snoring with afib - plan for home sleep test  Follow-up:  3 f/u  I spent 40 minutes in the care of Carolyn Snyder today including reviewing labs (06/19/22), face to face time discussing treatment options (31 min), and documenting in the encounter.   Weston Brass, MD, [facility]  [facility]   Medication Adjustments/Labs and Tests Ordered: Current medicines are reviewed at length with the patient today.  Concerns regarding medicines are outlined above.   Orders Placed This Encounter  Procedures   EKG 12-Lead   No orders of the defined types were placed in this encounter.  Patient Instructions  Medication Instructions:  Start: Apixiban (Eliquis) 5 mg (one tablet) two times daily.  Start: Propranolol (Inderal) 20 mg (one tablet), once daily at bedtime.  Lab Work: None  Testing/Procedures:  Christena Deem- Long Term Monitor Instructions  Your physician has requested you wear a ZIO patch monitor for 14 days.  This is a single patch monitor. [employer] supplies one patch monitor per enrollment. Additional stickers are not available. Please do not apply patch if you will be having a Nuclear Stress Test,  Echocardiogram, Cardiac CT, MRI, or Chest Xray during the period you would be wearing the  monitor. The patch cannot be worn during these tests. You cannot remove and re-apply the  ZIO XT patch monitor.  Your ZIO patch monitor will be mailed 3 day USPS to your address on file. It may take 3-5 days  to receive your monitor after you have been enrolled.  Once you have received your monitor, please review the enclosed instructions. Your monitor  has already been registered assigning a specific monitor serial # to you.  Billing and Patient Assistance Program Information  We have supplied [employer] with any of your insurance information on file for billing purposes. [employer]  offers a sliding scale Patient Assistance Program for patients that do not have  insurance, or whose insurance does not completely cover the cost of the ZIO monitor.  You must apply for the Patient Assistance Program to qualify for this discounted rate.  To apply, please call [employer] at [phone], select option 4, select option 2, ask to apply for  Patient Assistance Program. Meredeth Ide will ask your household income, and how many people  are in your household. They will quote your out-of-pocket cost based on that information.  [employer] will also be able to set up a 60-month, interest-free payment plan if  needed.  Applying the monitor   Shave hair from upper left chest.  Hold abrader disc by orange tab. Rub abrader in 40 strokes over the upper left chest as  indicated in your monitor instructions.  Clean area with 4 enclosed alcohol pads. Let dry.  Apply patch as indicated in monitor instructions. Patch will be placed under collarbone on left  side of chest with arrow pointing upward.  Rub patch adhesive wings for 2 minutes. Remove white label marked "1". Remove the white  label marked "2". Rub patch adhesive wings for 2 additional minutes.  While looking in a mirror, press and release button in center of patch. A small green light will  flash 3-4 times. This will be your only indicator that the monitor has been turned on.  Do not shower for the first 24 hours. You may shower after the first 24 hours.  Press the button if you feel a symptom. You will hear a small click. Record Date, Time and  Symptom in the Patient Logbook.  When you are ready to remove the patch, follow instructions on the last 2 pages of Patient  Logbook. Stick patch monitor onto the last page of Patient Logbook.  Place Patient Logbook in the blue and white box. Use locking tab on box and tape box closed  securely. The blue and white box has prepaid postage on it. Please place it in the mailbox as  soon as possible. Your  physician should have your test results approximately 7 days after the  monitor has been mailed back to [facility].  Call [facility] Customer Care at [phone] if you have questions regarding  your ZIO XT patch monitor. Call them immediately if you see an orange light blinking on your  monitor.  If your monitor falls off in less than 4 days, contact our Monitor department at [phone].  If your monitor becomes loose or falls off after 4 days call [employer] at [phone] for  suggestions on securing your monitor  --------------------------------------------------------------------  WatchPAT?  Is a FDA cleared portable home sleep study test that uses a watch and 3 points of contact to monitor 7 different channels, including your heart rate, oxygen saturations, body position, snoring, and chest motion.  The study is easy to use from the comfort of your own home and accurately detect sleep apnea.  Before bed, you attach the chest sensor, attached the sleep apnea bracelet to your nondominant hand, and attach the finger probe.  After the study, the raw data is downloaded from the watch and scored for apnea events.   For more information: [email or web address]  Patient Testing Instructions:  Do not put battery into the device until bedtime when you are ready to begin the test. Please call the support number if you need assistance after following the instructions below: 24 hour support line- [phone] or [employer] support at [phone] (option 2)  Download the IntelWatchPAT One" app through the google play store or App Store  Be sure to turn on or enable access to bluetooth in settlings on your smartphone/ device  Make sure no other bluetooth devices are on and within the vicinity of your smartphone/ device and WatchPAT watch during testing.  Make sure to leave your smart phone/ device plugged in and charging all night.  When ready for bed:  Follow the  instructions step by step in the WatchPAT One App to activate the testing device. For additional instructions, including video instruction, visit the WatchPAT One video on Youtube.  You can search for WatchPat One within Youtube (video is 4 minutes and 18 seconds) or enter: https://youtube/watch?[email or web address]_vbiwxE Please note: You will be prompted to enter a Pin to connect via bluetooth when starting the test. The PIN will be assigned to you when you receive the test.  The device is disposable, but it recommended that you retain the device until you receive a call letting you know the study has been received and the results have been interpreted.  We will let you know if the study did not transmit to [place] properly after the test is completed. You do not need to call us to confirm the receipt of the test.  Please complete the test within 48 hours of receiving PIN.   Frequently Asked Questions:  What is Watch Dennie Bible one?  A single use fully disposable home sleep apnea testing device and will not need to be returned after completion.  What are the requirements to use WatchPAT one?  The be able to have a successful watchpat one sleep study, you should have your Watch pat one device, your smart phone, watch pat one app, your PIN number and Internet access What type of phone do I need?  You should have a smart phone that uses Android 5.1 and above or any Iphone with IOS 10 and above How can I download the WatchPAT one app?  Based on your device type search for WatchPAT one app either in google play for android devices or APP store for Iphone's Where will I get my PIN for the study?  Your PIN will be provided by your physician's office. It is used for authentication and if you lose/forget your PIN, please reach out to your providers office.  I do not have Internet at home. Can I do WatchPAT one study?  WatchPAT One needs Internet connection throughout the night to be able to transmit the sleep data. You can use your  home/local internet or your cellular's data package. However, it is always recommended to use home/local Internet. It is estimated that between 20MB-30MB will be used with each study.However, the application will be looking for space in the phone to start the study.  What happens if I lose internet or bluetooth connection?  During the internet disconnection, your phone will not be able to transmit the sleep data. All the data, will be stored in your phone. As soon as the internet connection is back on, the phone will being sending the sleep data. During the bluetooth disconnection, WatchPAT one will not be able to to send the sleep data to your phone. Data will be kept in the [facility] one until two devices have bluetooth connection back on. As soon as the connection is back on, WatchPAT one will send the sleep data to the phone.  How long do I need to wear the WatchPAT one?  After you start the study, you should wear the device at least 6 hours.  How far should I keep my phone from the device?  During the night, your phone should be within 15 feet.  What happens if I leave the room for restroom or other reasons?  Leaving the room for any reason will not cause any problem. As soon as your get back to the room, both devices will reconnect and will continue to send the sleep data. Can I use my phone during the sleep study?  Yes, you can use your phone as usual during the study. But it is recommended to put your  watchpat one on when you are ready to go to bed.  How will I get my study results?  A soon as you completed your study, your sleep data will be sent to the provider. They will then share the results with you when they are ready.     Follow-Up: At [facility], you and your health needs are our priority.  As part of our continuing mission to provide you with exceptional heart care, we have created designated Provider Care Teams.  These Care Teams include your primary Cardiologist  (physician) and Advanced Practice Providers (APPs -  Physician Assistants and Nurse Practitioners) who all work together to provide you with the care you need, when you need it.   Your next appointment:   3 month(s)    Provider:   Parke Poisson, MD

## 2023-04-03 DIAGNOSIS — I493 Ventricular premature depolarization: Secondary | ICD-10-CM | POA: Diagnosis not present

## 2023-04-03 DIAGNOSIS — R030 Elevated blood-pressure reading, without diagnosis of hypertension: Secondary | ICD-10-CM | POA: Diagnosis not present

## 2023-04-03 DIAGNOSIS — E785 Hyperlipidemia, unspecified: Secondary | ICD-10-CM | POA: Diagnosis not present

## 2023-04-03 DIAGNOSIS — E559 Vitamin D deficiency, unspecified: Secondary | ICD-10-CM | POA: Diagnosis not present

## 2023-04-03 DIAGNOSIS — I480 Paroxysmal atrial fibrillation: Secondary | ICD-10-CM | POA: Diagnosis not present

## 2023-04-03 LAB — TSH: TSH: 1.64 u[IU]/mL (ref 0.450–4.500)

## 2023-04-04 ENCOUNTER — Encounter: Payer: Self-pay | Admitting: Internal Medicine

## 2023-04-07 ENCOUNTER — Encounter: Payer: Self-pay | Admitting: Internal Medicine

## 2023-04-07 ENCOUNTER — Telehealth: Payer: Self-pay

## 2023-04-07 NOTE — Telephone Encounter (Signed)
**Note De-Identified Carolyn Snyder Obfuscation** Ordering provider: Dr Jacques Navy Associated diagnoses: Palpitations-R00.2 and Fatigue-R53.83  WatchPAT PA obtained on 04/07/2023 by [name], [name], LPN. Authorization: Per the [employer] Provider Portal: The following solutions for the service date entered do not require Pre-Authorization by [employer]. CPT Code: 95621 ([facility]-HST).  Patient notified of PIN (1234) on 04/07/2023 [name] Notification Method: MyChart message.  Phone note routed to covering staff for follow-up.

## 2023-04-09 DIAGNOSIS — R000 Tachycardia, unspecified: Secondary | ICD-10-CM | POA: Diagnosis not present

## 2023-04-09 DIAGNOSIS — E782 Mixed hyperlipidemia: Secondary | ICD-10-CM | POA: Diagnosis not present

## 2023-04-09 DIAGNOSIS — I10 Essential (primary) hypertension: Secondary | ICD-10-CM | POA: Diagnosis not present

## 2023-04-09 DIAGNOSIS — E559 Vitamin D deficiency, unspecified: Secondary | ICD-10-CM | POA: Diagnosis not present

## 2023-04-09 DIAGNOSIS — Z789 Other specified health status: Secondary | ICD-10-CM | POA: Diagnosis not present

## 2023-04-10 ENCOUNTER — Encounter: Payer: Self-pay | Admitting: Internal Medicine

## 2023-04-11 ENCOUNTER — Encounter (INDEPENDENT_AMBULATORY_CARE_PROVIDER_SITE_OTHER): Payer: Self-pay | Admitting: Cardiology

## 2023-04-11 ENCOUNTER — Telehealth: Payer: Self-pay

## 2023-04-11 DIAGNOSIS — R0683 Snoring: Secondary | ICD-10-CM

## 2023-04-11 NOTE — Telephone Encounter (Signed)
 Notified patient that device is registered. Patient will attempt Home Sleep Study this weekend. Patient will contact sleep coordinator on Monday if there are any issues with device.

## 2023-04-15 ENCOUNTER — Ambulatory Visit: Attending: Internal Medicine

## 2023-04-15 DIAGNOSIS — I493 Ventricular premature depolarization: Secondary | ICD-10-CM

## 2023-04-15 DIAGNOSIS — I480 Paroxysmal atrial fibrillation: Secondary | ICD-10-CM

## 2023-04-15 NOTE — Procedures (Signed)
   SLEEP STUDY REPORT Patient Information Study Date: 04/11/2023 Patient Name: Carolyn Snyder Patient ID: [id number] Birth Date: November 22, 1955 Age: 68 Gender: Female BMI: 19.7 (W=130 lb, H=5' 8'') Referring Physician: Eulis Foster, MD  TEST DESCRIPTION: Home sleep apnea testing was completed using the WatchPat, a Type 1 device, utilizing peripheral arterial tonometry (PAT), chest movement, actigraphy, pulse oximetry, pulse rate, body position and snore. AHI was calculated with apnea and hypopnea using valid sleep time as the denominator. RDI includes apneas, hypopneas, and RERAs. The data acquired and the scoring of sleep and all associated events were performed in accordance with the recommended standards and specifications as outlined in the AASM Manual for the Scoring of Sleep and Associated Events 2.2.0 (2015).  FINDINGS: 1. No evidence of Obstructive Sleep Apnea with AHI 0.7/hr. 2. No Central Sleep Apnea. 3. Oxygen desaturations as low as 81%. 4. Mild snoring was present. O2 sats were < 88% for 0.2 minutes. 5. Total sleep time was 8 hrs and 56 min. 6. 18.3% of total sleep time was spent in REM sleep. 7. Normal sleep onset latency at 16 min. 8. Shortened REM sleep onset latency at 61 min. 9. Total awakenings were 5.  DIAGNOSIS: Normal study with no significant sleep disordered breathing.  RECOMMENDATIONS: 1. Normal study with no significant sleep disordered breathing.  2. Healthy sleep recommendations include: adequate nightly sleep (normal 7-9 hrs/night), avoidance of caffeine after noon and alcohol near bedtime, and maintaining a sleep environment that is cool, dark and quiet.  3. Weight loss for overweight patients is recommended.  4. Snoring recommendations include: weight loss where appropriate, side sleeping, and avoidance of alcohol before Bed . 5. Operation of motor vehicle or dangerous equipment must be avoided when feeling drowsy, excessively sleepy, or mentally  fatigued.  6. An ENT consultation which may be useful for specific causes of and possible treatment of bothersome snoring .  7. Weight loss may be of benefit in reducing the severity of snoring.   Signature: Armanda Magic, MD; [facility]; Diplomat, American Board of Sleep Medicine Electronically Signed: 04/15/2023 6:49:59 PM

## 2023-04-16 ENCOUNTER — Telehealth: Payer: Self-pay

## 2023-04-16 NOTE — Telephone Encounter (Signed)
Notified patient of sleep study results and recommendations. All questions (if any) were answered and patient verbalized understanding.

## 2023-04-16 NOTE — Telephone Encounter (Signed)
-----   Message from Armanda Magic sent at 04/15/2023  6:53 PM EDT ----- Please let patient know that sleep study showed no significant sleep apnea.

## 2023-04-28 ENCOUNTER — Encounter: Payer: Self-pay | Admitting: *Deleted

## 2023-04-29 DIAGNOSIS — I493 Ventricular premature depolarization: Secondary | ICD-10-CM | POA: Diagnosis not present

## 2023-04-29 DIAGNOSIS — I480 Paroxysmal atrial fibrillation: Secondary | ICD-10-CM | POA: Diagnosis not present

## 2023-05-06 DIAGNOSIS — K080 Exfoliation of teeth due to systemic causes: Secondary | ICD-10-CM | POA: Diagnosis not present

## 2023-07-09 DIAGNOSIS — E559 Vitamin D deficiency, unspecified: Secondary | ICD-10-CM | POA: Diagnosis not present

## 2023-07-09 DIAGNOSIS — I10 Essential (primary) hypertension: Secondary | ICD-10-CM | POA: Diagnosis not present

## 2023-07-09 DIAGNOSIS — E785 Hyperlipidemia, unspecified: Secondary | ICD-10-CM | POA: Diagnosis not present

## 2023-07-09 DIAGNOSIS — R5383 Other fatigue: Secondary | ICD-10-CM | POA: Diagnosis not present

## 2023-07-15 DIAGNOSIS — Z0000 Encounter for general adult medical examination without abnormal findings: Secondary | ICD-10-CM | POA: Diagnosis not present

## 2023-08-05 DIAGNOSIS — L812 Freckles: Secondary | ICD-10-CM | POA: Diagnosis not present

## 2023-08-05 DIAGNOSIS — B372 Candidiasis of skin and nail: Secondary | ICD-10-CM | POA: Diagnosis not present

## 2023-08-05 DIAGNOSIS — Z85828 Personal history of other malignant neoplasm of skin: Secondary | ICD-10-CM | POA: Diagnosis not present

## 2023-08-05 DIAGNOSIS — D220 Melanocytic nevi of lip: Secondary | ICD-10-CM | POA: Diagnosis not present

## 2023-08-11 ENCOUNTER — Ambulatory Visit: Attending: Internal Medicine | Admitting: Internal Medicine

## 2023-08-11 VITALS — BP 110/56 | HR 53 | Ht 68.5 in | Wt 128.8 lb

## 2023-08-11 DIAGNOSIS — R002 Palpitations: Secondary | ICD-10-CM

## 2023-08-11 DIAGNOSIS — I480 Paroxysmal atrial fibrillation: Secondary | ICD-10-CM

## 2023-08-11 DIAGNOSIS — I493 Ventricular premature depolarization: Secondary | ICD-10-CM

## 2023-08-11 DIAGNOSIS — M79605 Pain in left leg: Secondary | ICD-10-CM

## 2023-08-11 DIAGNOSIS — E782 Mixed hyperlipidemia: Secondary | ICD-10-CM

## 2023-08-11 DIAGNOSIS — M79604 Pain in right leg: Secondary | ICD-10-CM

## 2023-08-11 DIAGNOSIS — D6869 Other thrombophilia: Secondary | ICD-10-CM | POA: Diagnosis not present

## 2023-08-11 NOTE — Progress Notes (Signed)
 Cardiology Office Note:  .   Date:  08/11/2023  ID:  Carolyn Snyder, DOB 06-05-1955, MRN [id number] PCP: Waylan Almarie JONELLE, MD   [facility] Providers Cardiologist:  Soyla DELENA Merck, MD    History of Present Illness: .   Carolyn Snyder is a 68 y.o. female.  Discussed the use of AI scribe software for clinical note transcription with the patient, who gave verbal consent to proceed.  History of Present Illness Carolyn Snyder is a 68 year old female with atrial fibrillation who presents for follow-up on her heart monitor results and medication management.  The heart monitor showed no atrial fibrillation, and she experienced no issues during the monitoring period. She is taking propranolol  20 mg at bedtime, which has improved her blood pressure and reduced anxiety. Her blood pressure is generally in the 110s/50s, with a heart rate around 53, which she finds manageable. She previously tried diltiazem , which caused her heart rate and blood pressure to drop too low, resulting in discomfort.  She is on rosuvastatin 10 mg daily and experiences leg cramps at night, described as a 'restless leg kind of thing'. She previously tried Lipitor, which caused severe flu-like symptoms. Her recent lab work shows normal kidney function, normal sodium and potassium levels, triglycerides at 149, LDL at 66, and HDL at 63.  She has not consumed alcohol  since January and inquires about the safety of occasional alcohol  consumption, given her history of atrial fibrillation. She is cautious about potential triggers for AFib, such as sugar and caffeine, and is exploring non-alcoholic beverages and mocktails. She is currently taking Eliquis  and is mindful of the interactions between alcohol  and her medications.    ROS: negative except per HPI above.  Studies Reviewed: .        Results LABS Triglycerides: 149 (07/10/2023) LDL cholesterol: 66 (07/10/2023) HDL cholesterol: 63 (07/10/2023) Creatinine: 0.69  (07/10/2023)  DIAGNOSTIC Home sleep test: No evidence of sleep apnea ECG: Normal (03/2023) Risk Assessment/Calculations:    CHA2DS2-VASc Score = 2   This indicates a 2.2% annual risk of stroke. The patient's score is based upon: CHF History: 0 HTN History: 0 Diabetes History: 0 Stroke History: 0 Vascular Disease History: 0 Age Score: 1 Gender Score: 1       Physical Exam:   VS:  BP (!) 110/56 (BP Location: Left Arm, Patient Position: Sitting, Cuff Size: Normal)   Pulse (!) 53   Ht 5' 8.5 (1.74 m)   Wt 128 lb 12.8 oz (58.4 kg)   SpO2 99%   BMI 19.30 kg/m    Wt Readings from Last 3 Encounters:  08/11/23 128 lb 12.8 oz (58.4 kg)  04/02/23 129 lb (58.5 kg)  09/23/22 135 lb (61.2 kg)     Physical Exam VITALS: P- 53, BP- 110/56 GENERAL: Alert, cooperative, well developed, no acute distress. HEENT: Normocephalic, normal oropharynx, moist mucous membranes. CHEST: Clear to auscultation bilaterally, no wheezes, rhonchi, or crackles. CARDIOVASCULAR: Normal heart rate and rhythm, S1 and S2 normal without murmurs. ABDOMEN: Soft, non-tender, non-distended, without organomegaly, normal bowel sounds. EXTREMITIES: No cyanosis or edema. NEUROLOGICAL: Cranial nerves grossly intact, moves all extremities without gross motor or sensory deficit.   ASSESSMENT AND PLAN: .    Assessment and Plan Assessment & Plan Atrial fibrillation, paroxysmal PVCs - no recurrence of pvcs Secondary hypercoag state - home sleep test normal - No episodes detected on recent monitor. Well-managed on current medication regimen.  - Continue current medication regimen, including Eliquis  5 mg BID. -  If consuming alcohol , caffeine or sugar, hydrate well before and after to mitigate trigger for afib. -Propranolol  has improved anxiety symptoms without adverse effects. - Continue propranolol  20 mg at bedtime. - If heart rate drops to 40s or blood pressure is under 100, consider skipping a dose or taking half  a pill. - Ensure adequate hydration, especially on days with low blood pressure. - Monitor response to salt intake; increase salt intake if blood pressure is low.  Restless legs/ache likely due to rosuvastatin HLD Experiencing restless leg symptoms, likely due to rosuvastatin. Liver function tests are normal, and cholesterol levels are well-managed with rosuvastatin. - continue crestor 10 mg daily - Consider magnesium supplementation, starting with magnesium glycinate. - Consider CoQ10 supplementation, starting with 100 mg and increasing to 200 mg if needed. - Trial each supplement separately to determine effectiveness before combining.   Follow up 1 year  Soyla Merck, MD, FACC

## 2023-08-11 NOTE — Patient Instructions (Signed)
 Medication Instructions:  No changes *If you need a refill on your cardiac medications before your next appointment, please call your pharmacy*  Lab Work: None ordered If you have labs (blood work) drawn today and your tests are completely normal, you will receive your results only by: MyChart Message (if you have MyChart) OR A paper copy in the mail If you have any lab test that is abnormal or we need to change your treatment, we will call you to review the results.  Testing/Procedures: None ordered  Follow-Up: At [facility], you and your health needs are our priority.  As part of our continuing mission to provide you with exceptional heart care, our providers are all part of one team.  This team includes your primary Cardiologist (physician) and Advanced Practice Providers or APPs (Physician Assistants and Nurse Practitioners) who all work together to provide you with the care you need, when you need it.  Your next appointment:   1 year(s)  Provider:   Gayatri A Acharya, MD

## 2023-09-23 DIAGNOSIS — K080 Exfoliation of teeth due to systemic causes: Secondary | ICD-10-CM | POA: Diagnosis not present

## 2023-10-06 DIAGNOSIS — R000 Tachycardia, unspecified: Secondary | ICD-10-CM | POA: Diagnosis not present

## 2023-10-06 DIAGNOSIS — Z7185 Encounter for immunization safety counseling: Secondary | ICD-10-CM | POA: Diagnosis not present

## 2023-10-06 DIAGNOSIS — Z23 Encounter for immunization: Secondary | ICD-10-CM | POA: Diagnosis not present

## 2023-10-06 DIAGNOSIS — Z1339 Encounter for screening examination for other mental health and behavioral disorders: Secondary | ICD-10-CM | POA: Diagnosis not present

## 2023-10-06 DIAGNOSIS — Z1331 Encounter for screening for depression: Secondary | ICD-10-CM | POA: Diagnosis not present

## 2023-10-06 DIAGNOSIS — Z0000 Encounter for general adult medical examination without abnormal findings: Secondary | ICD-10-CM | POA: Diagnosis not present

## 2023-10-06 DIAGNOSIS — I10 Essential (primary) hypertension: Secondary | ICD-10-CM | POA: Diagnosis not present

## 2023-11-11 DIAGNOSIS — Z01419 Encounter for gynecological examination (general) (routine) without abnormal findings: Secondary | ICD-10-CM | POA: Diagnosis not present

## 2023-11-11 DIAGNOSIS — Z6821 Body mass index (BMI) 21.0-21.9, adult: Secondary | ICD-10-CM | POA: Diagnosis not present

## 2023-11-11 DIAGNOSIS — Z1231 Encounter for screening mammogram for malignant neoplasm of breast: Secondary | ICD-10-CM | POA: Diagnosis not present

## 2023-11-11 DIAGNOSIS — Z124 Encounter for screening for malignant neoplasm of cervix: Secondary | ICD-10-CM | POA: Diagnosis not present

## 2023-12-30 DIAGNOSIS — K080 Exfoliation of teeth due to systemic causes: Secondary | ICD-10-CM | POA: Diagnosis not present
